# Patient Record
Sex: Female | Born: 1960 | Race: White | Hispanic: No | Marital: Married
Health system: Southern US, Community
[De-identification: ages and names within clinical notes are randomized; demographics above are authoritative.]

## PROBLEM LIST (undated history)

## (undated) DIAGNOSIS — M5126 Other intervertebral disc displacement, lumbar region: Secondary | ICD-10-CM

## (undated) DIAGNOSIS — M1990 Unspecified osteoarthritis, unspecified site: Secondary | ICD-10-CM

## (undated) DIAGNOSIS — F419 Anxiety disorder, unspecified: Secondary | ICD-10-CM

---

## 2012-07-09 ENCOUNTER — Emergency Department: Payer: Self-pay | Admitting: Emergency Medicine

## 2012-07-09 LAB — COMPREHENSIVE METABOLIC PANEL
Alkaline Phosphatase: 63 U/L (ref 50–136)
Anion Gap: 14 (ref 7–16)
BUN: 15 mg/dL (ref 7–18)
Bilirubin,Total: 0.7 mg/dL (ref 0.2–1.0)
Chloride: 109 mmol/L — ABNORMAL HIGH (ref 98–107)
Creatinine: 0.89 mg/dL (ref 0.60–1.30)
EGFR (African American): >60
EGFR (Non-African Amer.): >60
Glucose: 169 mg/dL — ABNORMAL HIGH (ref 65–99)
Osmolality: 284 (ref 275–301)
Potassium: 3.6 mmol/L (ref 3.5–5.1)
SGOT(AST): 17 U/L (ref 15–37)
SGPT (ALT): 21 U/L (ref 12–78)

## 2012-07-09 LAB — URINALYSIS, COMPLETE
Bilirubin,UR: NEGATIVE
Nitrite: NEGATIVE
Squamous Epithelial: NONE SEEN
WBC UR: 96 /HPF (ref 0–5)

## 2012-07-09 LAB — CBC
HCT: 40.7 % (ref 35.0–47.0)
HGB: 13.6 g/dL (ref 12.0–16.0)
MCHC: 33.5 g/dL (ref 32.0–36.0)
MCV: 97 fL (ref 80–100)
Platelet: 256 10*3/uL (ref 150–440)
RBC: 4.2 10*6/uL (ref 3.80–5.20)

## 2012-07-09 LAB — LIPASE, BLOOD: Lipase: 92 U/L (ref 73–393)

## 2021-01-08 ENCOUNTER — Emergency Department (HOSPITAL_COMMUNITY)
Admission: EM | Admit: 2021-01-08 | Discharge: 2021-01-08 | Disposition: A | Discharge status: Home / Self Care | Payer: Commercial Managed Care - PPO | Attending: Emergency Medicine | Admitting: Emergency Medicine

## 2021-01-08 ENCOUNTER — Other Ambulatory Visit: Payer: Self-pay

## 2021-01-08 ENCOUNTER — Emergency Department (HOSPITAL_COMMUNITY): Payer: Commercial Managed Care - PPO

## 2021-01-08 DIAGNOSIS — M79605 Pain in left leg: Secondary | ICD-10-CM | POA: Insufficient documentation

## 2021-01-08 DIAGNOSIS — M545 Low back pain, unspecified: Secondary | ICD-10-CM | POA: Insufficient documentation

## 2021-01-08 DIAGNOSIS — M25562 Pain in left knee: Secondary | ICD-10-CM | POA: Diagnosis not present

## 2021-01-08 DIAGNOSIS — R112 Nausea with vomiting, unspecified: Secondary | ICD-10-CM | POA: Insufficient documentation

## 2021-01-08 MED ORDER — ONDANSETRON 4 MG PO TBDP
8.0000 mg | ORAL_TABLET | Freq: Once | ORAL | Status: AC
  Administered 2021-01-08: 8 mg via ORAL
  Filled 2021-01-08: qty 2

## 2021-01-08 MED ORDER — MORPHINE SULFATE (PF) 4 MG/ML IV SOLN
4.0000 mg | Freq: Once | INTRAVENOUS | Status: AC
Start: 2021-01-08 — End: 2021-01-08
  Administered 2021-01-08: 4 mg via INTRAMUSCULAR
  Filled 2021-01-08: qty 1

## 2021-01-08 MED ORDER — OXYCODONE-ACETAMINOPHEN 5-325 MG PO TABS
1.0000 | ORAL_TABLET | ORAL | Status: DC | PRN
  Administered 2021-01-08: 1 via ORAL
  Filled 2021-01-08: qty 1

## 2021-01-08 MED ORDER — ONDANSETRON HCL 4 MG/2ML IJ SOLN: 4.0000 mg | Freq: Once | INTRAMUSCULAR | Status: DC

## 2021-01-08 MED ORDER — MORPHINE SULFATE (PF) 4 MG/ML IV SOLN
4.0000 mg | Freq: Once | INTRAVENOUS | Status: DC
Start: 2021-01-08 — End: 2021-01-08

## 2021-01-08 MED ORDER — LIDOCAINE 5 % EX PTCH
1.0000 | MEDICATED_PATCH | CUTANEOUS | Status: DC
  Administered 2021-01-08: 1 via TRANSDERMAL
  Filled 2021-01-08: qty 1

## 2021-01-08 MED ORDER — HYDROCODONE-ACETAMINOPHEN 5-325 MG PO TABS
1.0000 | ORAL_TABLET | Freq: Once | ORAL | Status: DC
Start: 2021-01-08 — End: 2021-01-08

## 2021-01-08 MED ORDER — MORPHINE SULFATE (PF) 4 MG/ML IV SOLN
4.0000 mg | Freq: Once | INTRAVENOUS | Status: AC
  Administered 2021-01-08: 4 mg via INTRAMUSCULAR
  Filled 2021-01-08: qty 1

## 2021-01-08 MED ORDER — PREDNISONE 10 MG PO TABS: 20.0000 mg | ORAL_TABLET | Freq: Every day | ORAL | 0 refills | Status: DC

## 2021-01-08 NOTE — Discharge Instructions (Addendum)
Please take prednisone 20 mg in the morning and at night (40 mg daily) x 5 days.  This will treat underlying inflammation and help alleviate the pain moving forward.  It may take a day or 2 before you notice complete results.  Stick with it and continue taking the medicine.  You may also take Tylenol as needed for breakthrough pain.  Follow up with your chiropractor - results are listed below.   Segmentation: 5 lumbar type vertebrae.     Alignment: Preserved.     Vertebrae: Vertebral body heights are maintained. There is no acute  fracture.     Paraspinal and other soft tissues: Unremarkable.     Disc levels: Intervertebral disc heights are maintained. There is no  significant stenosis.     IMPRESSION:  There is no compression fracture.

## 2021-01-08 NOTE — ED Triage Notes (Signed)
Pt reports left sided lower back pain that goes down her left leg.  She is hyperventilating in triage from the pain.  OTC medications are not relieving the pain.
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
## 2021-04-01 ENCOUNTER — Ambulatory Visit: Payer: Commercial Managed Care - PPO | Admitting: Dermatology

## 2021-04-01 ENCOUNTER — Other Ambulatory Visit: Payer: Self-pay

## 2021-04-01 DIAGNOSIS — L814 Other melanin hyperpigmentation: Secondary | ICD-10-CM

## 2021-04-01 DIAGNOSIS — D1801 Hemangioma of skin and subcutaneous tissue: Secondary | ICD-10-CM

## 2021-04-01 DIAGNOSIS — L578 Other skin changes due to chronic exposure to nonionizing radiation: Secondary | ICD-10-CM

## 2021-04-01 DIAGNOSIS — Z1283 Encounter for screening for malignant neoplasm of skin: Secondary | ICD-10-CM | POA: Diagnosis not present

## 2021-04-01 DIAGNOSIS — L878 Other transepidermal elimination disorders: Secondary | ICD-10-CM

## 2021-04-01 DIAGNOSIS — L821 Other seborrheic keratosis: Secondary | ICD-10-CM

## 2021-04-01 DIAGNOSIS — L988 Other specified disorders of the skin and subcutaneous tissue: Secondary | ICD-10-CM

## 2021-04-01 DIAGNOSIS — D229 Melanocytic nevi, unspecified: Secondary | ICD-10-CM

## 2021-04-01 NOTE — Progress Notes (Signed)
   New Patient Visit  Subjective  Patricia Stephenson is a 60 y.o. female who presents for the following: New Patient (Initial Visit) (Patient as new patient. Patient denies any family history of skin cancer. Patient reports some white spots at legs. ).   The following portions of the chart were reviewed this encounter and updated as appropriate:   Allergies  Meds  Problems  Med Hx  Surg Hx  Fam Hx      Review of Systems:  No other skin or systemic complaints except as noted in HPI or Assessment and Plan.  Objective  Well appearing patient in no apparent distress; mood and affect are within normal limits.  A full examination was performed including scalp, head, eyes, ears, nose, lips, neck, chest, axillae, abdomen, back, buttocks, bilateral upper extremities, bilateral lower extremities, hands, feet, fingers, toes, fingernails, and toenails. All findings within normal limits unless otherwise noted below.  lips Rhytides and volume loss.    Assessment & Plan  Elastosis of skin lips  Concerned with lines around lips    Recommend botox at lips 4 units at top and 2 units at bottom   Discussed lasts 3 - 4 months. Pt to schedule.  Also discussed peels, filler.  Lentigines - Scattered tan macules - Due to sun exposure - Benign-appearing, observe - Recommend daily broad spectrum sunscreen SPF 30+ to sun-exposed areas, reapply every 2 hours as needed. - Call for any changes  Seborrheic Keratoses - Stuck-on, waxy, tan-brown papules and/or plaques  - Benign-appearing - Discussed benign etiology and prognosis. - Observe - Call for any changes  Melanocytic Nevi - Tan-brown and/or pink-flesh-colored symmetric macules and papules - Benign appearing on exam today - Observation - Call clinic for new or changing moles - Recommend daily use of broad spectrum spf 30+ sunscreen to sun-exposed areas.   Hemangiomas - Red papules - Discussed benign nature - Observe - Call for any  changes  Actinic Damage - Chronic condition, secondary to cumulative UV/sun exposure - diffuse scaly erythematous macules with underlying dyspigmentation - Recommend daily broad spectrum sunscreen SPF 30+ to sun-exposed areas, reapply every 2 hours as needed.  - Staying in the shade or wearing long sleeves, sun glasses (UVA+UVB protection) and wide brim hats (4-inch brim around the entire circumference of the hat) are also recommended for sun protection.  - Call for new or changing lesions.  Skin cancer screening performed today.  Return for botox follow up for lips.  I, Ruthell Rummage, CMA, am acting as scribe for Forest Gleason, MD.  Documentation: I have reviewed the above documentation for accuracy and completeness, and I agree with the above.  Forest Gleason, MD

## 2021-04-01 NOTE — Patient Instructions (Addendum)
Ammonium lactate   Vaseline and band aid at excoriation at right upper back until healed    Recommend taking Heliocare sun protection supplement daily in sunny weather for additional sun protection. For maximum protection on the sunniest days, you can take up to 2 capsules of regular Heliocare OR take 1 capsule of Heliocare Ultra. For prolonged exposure (such as a full day in the sun), you can repeat your dose of the supplement 4 hours after your first dose. Heliocare can be purchased at [facility] or at [email or web address].    Melanoma ABCDEs  Melanoma is the most dangerous type of skin cancer, and is the leading cause of death from skin disease.  You are more likely to develop melanoma if you: Have light-colored skin, light-colored eyes, or red or blond hair Spend a lot of time in the sun Tan regularly, either outdoors or in a tanning bed Have had blistering sunburns, especially during childhood Have a close family member who has had a melanoma Have atypical moles or large birthmarks  Early detection of melanoma is key since treatment is typically straightforward and cure rates are extremely high if we catch it early.   The first sign of melanoma is often a change in a mole or a new dark spot.  The ABCDE system is a way of remembering the signs of melanoma.  A for asymmetry:  The two halves do not match. B for border:  The edges of the growth are irregular. C for color:  A mixture of colors are present instead of an even brown color. D for diameter:  Melanomas are usually (but not always) greater than 40mm - the size of a pencil eraser. E for evolution:  The spot keeps changing in size, shape, and color.  Please check your skin once per month between visits. You can use a small mirror in front and a large mirror behind you to keep an eye on the back side or your body.   If you see any new or changing lesions before your next follow-up, please call to schedule a visit.  Please  continue daily skin protection including broad spectrum sunscreen SPF 30+ to sun-exposed areas, reapplying every 2 hours as needed when you're outdoors.   Staying in the shade or wearing long sleeves, sun glasses (UVA+UVB protection) and wide brim hats (4-inch brim around the entire circumference of the hat) are also recommended for sun protection.    If you have any questions or concerns for your doctor, please call our main line at [phone] and press option 4 to reach your doctor's medical assistant. If no one answers, please leave a voicemail as directed and we will return your call as soon as possible. Messages left after 4 pm will be answered the following business day.   You may also send [place] a message via [place]. We typically respond to MyChart messages within 1-2 business days.  For prescription refills, please ask your pharmacy to contact our office. Our fax number is [phone].  If you have an urgent issue when the clinic is closed that cannot wait until the next business day, you can page your doctor at the number below.    Please note that while we do our best to be available for urgent issues outside of office hours, we are not available 24/7.   If you have an urgent issue and are unable to reach [place], you may choose to seek medical care at your doctor's office, retail clinic, urgent  care center, or emergency room.  If you have a medical emergency, please immediately call 911 or go to the emergency department.  Pager Numbers  - Dr. Nehemiah Massed: [phone]  - Dr. Laurence Ferrari: [phone]  - Dr. Nicole Kindred: [phone]  In the event of inclement weather, please call our main line at [phone] for an update on the status of any delays or closures.  Dermatology Medication Tips: Please keep the boxes that topical medications come in in order to help keep track of the instructions about where and how to use these. Pharmacies typically print the medication instructions only on the  boxes and not directly on the medication tubes.   If your medication is too expensive, please contact our office at [phone] option 4 or send [place] a message through [place].   We are unable to tell what your co-pay for medications will be in advance as this is different depending on your insurance coverage. However, we may be able to find a substitute medication at lower cost or fill out paperwork to get insurance to cover a needed medication.   If a prior authorization is required to get your medication covered by your insurance company, please allow [place] 1-2 business days to complete this process.  Drug prices often vary depending on where the prescription is filled and some pharmacies may offer cheaper prices.  The website www.goodrx.com contains coupons for medications through different pharmacies. The prices here do not account for what the cost may be with help from insurance (it may be cheaper with your insurance), but the website can give you the price if you did not use any insurance.  - You can print the associated coupon and take it with your prescription to the pharmacy.  - You may also stop by our office during regular business hours and pick up a GoodRx coupon card.  - If you need your prescription sent electronically to a different pharmacy, notify our office through [facility] or by phone at [phone] option 4.

## 2021-04-07 ENCOUNTER — Encounter: Payer: Self-pay | Admitting: Dermatology

## 2021-04-21 ENCOUNTER — Other Ambulatory Visit: Payer: Self-pay

## 2021-04-21 ENCOUNTER — Encounter: Payer: Self-pay | Admitting: Obstetrics & Gynecology

## 2021-04-21 ENCOUNTER — Ambulatory Visit (INDEPENDENT_AMBULATORY_CARE_PROVIDER_SITE_OTHER): Payer: Commercial Managed Care - PPO | Admitting: Obstetrics & Gynecology

## 2021-04-21 ENCOUNTER — Other Ambulatory Visit (HOSPITAL_COMMUNITY)
Admission: RE | Admit: 2021-04-21 | Discharge: 2021-04-21 | Disposition: A | Discharge status: Home / Self Care | Payer: Commercial Managed Care - PPO | Source: Ambulatory Visit | Attending: Obstetrics & Gynecology | Admitting: Obstetrics & Gynecology

## 2021-04-21 VITALS — BP 138/78 | HR 65 | Ht 62.0 in | Wt 122.0 lb

## 2021-04-21 DIAGNOSIS — N952 Postmenopausal atrophic vaginitis: Secondary | ICD-10-CM

## 2021-04-21 DIAGNOSIS — Z124 Encounter for screening for malignant neoplasm of cervix: Secondary | ICD-10-CM

## 2021-04-21 DIAGNOSIS — N941 Unspecified dyspareunia: Secondary | ICD-10-CM | POA: Diagnosis present

## 2021-04-21 MED ORDER — ESTRADIOL 0.1 MG/GM VA CREA: TOPICAL_CREAM | VAGINAL | 4 refills | Status: DC

## 2021-04-21 NOTE — Patient Instructions (Addendum)
Oral hormone therapy formulation is Prempro (covered by your insurance).  Discussed using hormone therapy and concerns about increased risk of heart disease, strokes, dangerous blood clots,   and breast cancer.

## 2021-04-21 NOTE — Progress Notes (Signed)
GYNECOLOGY OFFICE VISIT NOTE  History:   Patricia Stephenson is a 60 y.o. [id number] PMP female here today for evaluation of pain during intercourse for months. Has history of back pain, MRI done for this incidentally showed two small  (2.5 cm and 1 cm) fibroids in 01/30/2021. She tried 'warming' lubricant, this caused irritation. Pain is mostly on her left. Also reports decreased libido for years, since after menopause.  She denies any current abnormal vaginal discharge, bleeding, abdominal pain, pelvic pain or other concerns.    History reviewed. No pertinent past medical history.  Past Surgical History:  Procedure Laterality Date   CESAREAN SECTION      The following portions of the patient's history were reviewed and updated as appropriate: allergies, current medications, past family history, past medical history, past social history, past surgical history and problem list.   Health Maintenance:  Normal pap but absent EC/TZ, no HPV testing done in 11/2019 at [facility].  Normal mammogram on 12/26/2019 at [facility].   Review of Systems:  Pertinent items noted in HPI and remainder of comprehensive ROS otherwise negative.  Physical Exam:  BP 138/78   Pulse 65   Ht 5\' 2"  (1.575 m)   Wt 122 lb (55.3 kg)   BMI 22.31 kg/m  CONSTITUTIONAL: Well-developed, well-nourished female in no acute distress.  SKIN: No rash noted. Not diaphoretic. No erythema. No pallor. MUSCULOSKELETAL: Normal range of motion. No edema noted. NEUROLOGIC: Alert and oriented to person, place, and time. Normal muscle tone coordination. No cranial nerve deficit noted. PSYCHIATRIC: Normal mood and affect. Normal behavior. Normal judgment and thought content. CARDIOVASCULAR: Normal heart rate noted RESPIRATORY: Effort and breath sounds normal, no problems with respiration noted ABDOMEN: No masses noted. No other overt distention noted. No tenderness.  PELVIC: External genitalia with moderate atrophy noted, small introitus; normal urethral  meatus; atrophic vaginal mucosa and cervix.  No abnormal discharge noted.  Pap smear obtained, this caused scant bleeding due to cervical atrophy. Normal uterine size, no other palpable masses, no uterine or adnexal tenderness. Performed in the presence of a chaperone.    Assessment and Plan:     1. Dyspareunia in female Likely due to menopausal vulvovaginal atrophy. Recommended estrogen therapy (see below). Otherwise reassuring examination findings. Not concerned about fibroids as they have been present for years, and likely got smaller with menopause, not etiology of her pain. Offered pelvic ultrasound for further evaluation, she declined for now.  - Cervicovaginal ancillary only done to rule out vaginitis  2. Vaginal atrophy Recommended estrogen therapy, discussed risks and benefits.  Also discussed alternative of oral HRT (Prempro as will need estrogen and progesterone); discussed using hormone therapy and concerns about increased risk of heart disease, strokes, dangerous blood clots, and breast cancer. She wants to try estrogen cream for now, may decide to switch to Prempro later.  - estradiol (ESTRACE VAGINAL) 0.1 MG/GM vaginal cream; Use 2 g daily vaginally for two weeks, then use two to three times weekly for up to four months.  Dispense: 42.5 g; Refill: 4  3. Pap smear for cervical cancer screening - Cytology - PAP Inadequate pap smear last year, pap smear repeated this year. will follow up results and manage accordingly.  Routine preventative health maintenance measures emphasized. Please refer to After Visit Summary for other counseling recommendations.   Return for any gynecologic concerns.    I spent 20 minutes dedicated to the care of this patient including pre-visit review of records, face to face time  with the patient discussing her conditions and treatments and post visit orders.    Verita Schneiders, MD, [place] for [employer], [place]

## 2021-04-22 LAB — CYTOLOGY - PAP
Comment: NEGATIVE
Diagnosis: NEGATIVE
High risk HPV: NEGATIVE

## 2021-04-23 LAB — CERVICOVAGINAL ANCILLARY ONLY
Bacterial Vaginitis (gardnerella): NEGATIVE
Candida Glabrata: NEGATIVE
Candida Vaginitis: NEGATIVE
Chlamydia: NEGATIVE
Comment: NEGATIVE
Comment: NEGATIVE
Comment: NEGATIVE
Comment: NEGATIVE
Comment: NEGATIVE
Comment: NORMAL
Neisseria Gonorrhea: NEGATIVE
Trichomonas: NEGATIVE

## 2021-05-01 ENCOUNTER — Telehealth: Payer: Self-pay | Admitting: Obstetrics & Gynecology

## 2021-05-01 NOTE — Telephone Encounter (Signed)
Called patient regarding medication concern.  She notes nausea, headache and night sweats that recently started after using the vaginal cream.  She was curious to know next step and if she needed to be seen.  Reassured pt that an appointment is not indicated, but she could decrease either amount and/or frequency of usage.  Plan to cut back to 1g and may consider just twice per week.  Plans to follow up with Dr. Caprice Kluver, DO Attending [place], [facility] for [facility], [place]

## 2021-06-10 ENCOUNTER — Telehealth: Payer: Self-pay

## 2021-06-10 NOTE — Telephone Encounter (Signed)
This pt came in the office today in regards to her visit here on 04/21/2021 and was angry about her bill and the analyzation of her pap that she received here. She stated that she has $220 in charges for items that were performed and not covered by her insurance. When she called her insurance company her husband was there and was inquiring as to why she needed or received STI testing with a pap. Her visit is for new gyn- pain during intercourse and she was upset about the way we made her feel or look in her relationship. She wants to get a phone call from Dr A and [place] because we stated false truth on her mychart and wants to know how we can fix this

## 2021-06-11 ENCOUNTER — Encounter: Payer: Self-pay | Admitting: Obstetrics & Gynecology

## 2021-10-20 ENCOUNTER — Encounter (HOSPITAL_BASED_OUTPATIENT_CLINIC_OR_DEPARTMENT_OTHER): Payer: Self-pay | Admitting: Orthopaedic Surgery

## 2021-10-20 ENCOUNTER — Other Ambulatory Visit: Payer: Self-pay

## 2021-10-27 NOTE — H&P (Signed)
PREOPERATIVE H&P  Chief Complaint: OA RIGHT SHOULDER. ROTATOR CUFF TEAR  HPI: Patricia Stephenson is a 61 y.o. female who is scheduled for, Procedure(s): SHOULDER ARTHROSCOPY WITH SUBACROMIAL DECOMPRESSION, DISTAL CLAVICLE EXCISION, BICEPS TENODESIS, POSSIBLE ROTATOR CUFF REPAIR.   Patient has a past medical history significant for back pain.   Patient is a 61 year-old who has had ongoing pain in the right upper extremity.  She tried for 11 months to get better.  She works as a Print production planner.  She is frustrated by her shoulder.  She had care at another orthopedic office and has had multiple injections which provided temporary, but transient, relief.  She had an MRI done in November of 2022 that, per her report, demonstrated a leading edge partial thickness 50% supraspinatus tear.  There is a subscapularis tear at the upper border as well.  The long head of the biceps has some signal around it.    Symptoms are rated as moderate to severe, and have been worsening.  This is significantly impairing activities of daily living.    Please see clinic note for further details on this patient's care.    She has elected for surgical management.   Past Medical History:  Diagnosis Date   Lumbar herniated disc    Past Surgical History:  Procedure Laterality Date   CESAREAN SECTION     Social History   Socioeconomic History   Marital status: Married    Spouse name: Not on file   Number of children: Not on file   Years of education: Not on file   Highest education level: Not on file  Occupational History   Not on file  Tobacco Use   Smoking status: Never   Smokeless tobacco: Never  Substance and Sexual Activity   Alcohol use: Never   Drug use: Never   Sexual activity: Yes    Partners: Male  Other Topics Concern   Not on file  Social History Narrative   Not on file   Social Determinants of Health   Financial Resource Strain: Not on file  Food Insecurity: Not on file   Transportation Needs: Not on file  Physical Activity: Not on file  Stress: Not on file  Social Connections: Not on file   History reviewed. No pertinent family history. No Known Allergies Prior to Admission medications   Medication Sig Start Date End Date Taking? Authorizing Provider  acetaminophen (TYLENOL) 325 MG tablet Take 650 mg by mouth every 6 (six) hours as needed.   Yes [provider]  buPROPion (WELLBUTRIN SR) 200 MG 12 hr tablet Take 200 mg by mouth every morning. 01/06/21  Yes [provider]  ibuprofen (ADVIL) 200 MG tablet Take 200 mg by mouth every 6 (six) hours as needed.   Yes [provider]  Multiple Vitamin (MULTIVITAMIN) tablet Take 1 tablet by mouth daily.   Yes [provider]  celecoxib (CELEBREX) 100 MG capsule Take 100 mg by mouth 2 (two) times daily. 12/23/20   [provider]  estradiol (ESTRACE VAGINAL) 0.1 MG/GM vaginal cream Use 2 g daily vaginally for two weeks, then use two to three times weekly for up to four months. 04/21/21   Anyanwu, Sallyanne Havers, MD  LORazepam (ATIVAN) 0.5 MG tablet Take 0.5 mg by mouth 2 (two) times daily as needed for anxiety. 12/23/20   [provider]    ROS: All other systems have been reviewed and were otherwise negative with the exception of those mentioned in  the HPI and as above.  Physical Exam: General: Alert, no acute distress Cardiovascular: No pedal edema Respiratory: No cyanosis, no use of accessory musculature GI: No organomegaly, abdomen is soft and non-tender Skin: No lesions in the area of chief complaint Neurologic: Sensation intact distally Psychiatric: Patient is competent for consent with normal mood and affect Lymphatic: No axillary or cervical lymphadenopathy  MUSCULOSKELETAL:  Active forward elevation to 120.  Pain afterwards.  Passive to 160.  External rotation to 60.  Internal rotation to back pocket.  4/5 supraspinatus strength.  Positive O'Brien's,  impingement and AC tenderness to palpation.    Imaging: MRI per report demonstrated a leading edge partial thickness 50% supraspinatus tear.  There is a subscapularis tear at the upper border as well.  The long head of the biceps has some signal around it.    Assessment: OA RIGHT SHOULDER. ROTATOR CUFF TEAR  Plan: Plan for Procedure(s): SHOULDER ARTHROSCOPY WITH SUBACROMIAL DECOMPRESSION, DISTAL CLAVICLE EXCISION, BICEPS TENODESIS, POSSIBLE ROTATOR CUFF REPAIR  Patient seems to have tried and failed multiple appropriate non-operative measures.  We talked to her at length about the likelihood of continued pain, especially her previous use of narcotics.    The risks benefits and alternatives were discussed with the patient including but not limited to the risks of nonoperative treatment, versus surgical intervention including infection, bleeding, nerve injury,  blood clots, cardiopulmonary complications, morbidity, mortality, among others, and they were willing to proceed.   The patient acknowledged the explanation, agreed to proceed with the plan and consent was signed.   Operative Plan: Right shoulder scope with rotator cuff repair, distal clavicle excision, subacromial decompression and biceps tenodesis  Discharge Medications: Standard - celebrex, gabapentin, tylenol, oxycodone, zofran DVT Prophylaxis: None Physical Therapy: Outpatient PT - [facility] Special Discharge needs: Sling. Mars, PA-C  10/27/2021 4:36 PM

## 2021-10-27 NOTE — Discharge Instructions (Addendum)
Ophelia Charter MD, MPH Noemi Chapel, PA-C [place] [address] [phone] (tel)   [phone] (fax)   POST-OPERATIVE INSTRUCTIONS - SHOULDER ARTHROSCOPY  WOUND CARE You may remove the Operative Dressing on Post-Op Day #3 (72hrs after surgery).   Alternatively if you would like you can leave dressing on until follow-up if within 7-8 days but keep it dry. Leave steri-strips in place until they fall off on their own, usually 2 weeks postop. There may be a small amount of fluid/bleeding leaking at the surgical site.  This is normal; the shoulder is filled with fluid during the procedure and can leak for 24-48hrs after surgery.  You may change/reinforce the bandage as needed.  Use the Cryocuff or Ice as often as possible for the first 7 days, then as needed for pain relief. Always keep a towel, ACE wrap or other barrier between the cooling unit and your skin.  You may shower on Post-Op Day #3. Gently pat the area dry. Do not soak the shoulder in water or submerge it. Keep incisions as dry as possible. Do not go swimming in the pool or ocean until 4 weeks after surgery or when otherwise instructed.    SLING/EXERCISES: - Sling should be used at all times until follow-up. (including when you sleep!)   - You may remove sling for hygiene - Do not lift anything heavy with your operative arm! - It is normal for your fingers/hand to become more swollen after surgery and discolored from bruising.   - This will resolve over the first few weeks usually after surgery. - Please continue to ambulate and do not stay sitting or lying for too long.  - Perform foot and wrist pumps to assist in circulation.  PHYSICAL THERAPY You have a physical therapy appointment at [facility] (across the hall from our office) on Tuesday, 6/13 '@10'$  am POST-OP MEDICATIONS- Multimodal approach to pain control In general your pain will be controlled with a combination of substances.   Prescriptions unless otherwise discussed are electronically sent to your pharmacy.  This is a carefully made plan we use to minimize narcotic use.     Celebrex - Anti-inflammatory medication taken on a scheduled basis Acetaminophen - Non-narcotic pain medicine taken on a scheduled basis  Gabapentin - this is a medication to help with nerve based pain, take on a scheduled basis Oxycodone - This is a strong narcotic, to be used only on an "as needed" basis for SEVERE pain. Zofran - take as needed for nausea  FOLLOW-UP If you develop a Fever (?101.5), Redness or Drainage from the surgical incision site, please call our office to arrange for an evaluation. Please call the office to schedule a follow-up appointment 10-14 days post-operatively.    HELPFUL INFORMATION  If you had a block, it will wear off between 8-24 hrs postop typically.  This is period when your pain may go from nearly zero to the pain you would have had postop without the block.  This is an abrupt transition but nothing dangerous is happening.  You may take an extra dose of narcotic when this happens.  You may be more comfortable sleeping in a semi-seated position the first few nights following surgery.  Keep a pillow propped under the elbow and forearm for comfort.  If you have a recliner type of chair it might be beneficial.  If not that is fine too, but it would be helpful to sleep propped up with pillows behind your  operated shoulder as well under your elbow and forearm.  This will reduce pulling on the suture lines.  When dressing, put your operative arm in the sleeve first.  When getting undressed, take your operative arm out last.  Loose fitting, button-down shirts are recommended.  Often in the first days after surgery you may be more comfortable keeping your operative arm under your shirt and not through the sleeve.  You may return to work/school in the next couple of days when you feel up to it.  Desk work and typing in  the sling is fine.  We suggest you use the pain medication the first night prior to going to bed, in order to ease any pain when the anesthesia wears off. You should avoid taking pain medications on an empty stomach as it will make you nauseous.  You should wean off your narcotic medicines as soon as you are able.  Most patients will be off or using minimal narcotics before their first postop appointment.   Do not drink alcoholic beverages or take illicit drugs when taking pain medications.  It is against the law to drive while taking narcotics.  In some states it is against the law to drive while your arm is in a sling.   Pain medication may make you constipated.  Below are a few solutions to try in this order: Decrease the amount of pain medication if you aren't having pain. Drink lots of decaffeinated fluids. Drink prune juice and/or eat dried prunes  If the first 3 don't work start with additional solutions Take Colace - an over-the-counter stool softener Take Senokot - an over-the-counter laxative Take Miralax - a stronger over-the-counter laxative  For more information including helpful videos and documents visit our website:   [email or web address]-[email or web address]   Next dose of Tylenol can be taken at 1pm today. Next dose of Ibuprofen can be taken at 3pm today.   Post Anesthesia Home Care Instructions  Activity: Get plenty of rest for the remainder of the day. A responsible individual must stay with you for 24 hours following the procedure.  For the next 24 hours, DO NOT: -Drive a car -Paediatric nurse -Drink alcoholic beverages -Take any medication unless instructed by your physician -Make any legal decisions or sign important papers.  Meals: Start with liquid foods such as gelatin or soup. Progress to regular foods as tolerated. Avoid greasy, spicy, heavy foods. If nausea and/or vomiting occur, drink only clear liquids until the nausea and/or vomiting  subsides. Call your physician if vomiting continues.  Special Instructions/Symptoms: Your throat may feel dry or sore from the anesthesia or the breathing tube placed in your throat during surgery. If this causes discomfort, gargle with warm salt water. The discomfort should disappear within 24 hours.  If you had a scopolamine patch placed behind your ear for the management of post- operative nausea and/or vomiting:  1. The medication in the patch is effective for 72 hours, after which it should be removed.  Wrap patch in a tissue and discard in the trash. Wash hands thoroughly with soap and water. 2. You may remove the patch earlier than 72 hours if you experience unpleasant side effects which may include dry mouth, dizziness or visual disturbances. 3. Avoid touching the patch. Wash your hands with soap and water after contact with the patch.      Regional Anesthesia Blocks  1. Numbness or the inability to move the "blocked" extremity may last from 3-48 hours after placement. The length of  time depends on the medication injected and your individual response to the medication. If the numbness is not going away after 48 hours, call your surgeon.  2. The extremity that is blocked will need to be protected until the numbness is gone and the  Strength has returned. Because you cannot feel it, you will need to take extra care to avoid injury. Because it may be weak, you may have difficulty moving it or using it. You may not know what position it is in without looking at it while the block is in effect.  3. For blocks in the legs and feet, returning to weight bearing and walking needs to be done carefully. You will need to wait until the numbness is entirely gone and the strength has returned. You should be able to move your leg and foot normally before you try and bear weight or walk. You will need someone to be with you when you first try to ensure you do not fall and possibly risk injury.  4.  Bruising and tenderness at the needle site are common side effects and will resolve in a few days.  5. Persistent numbness or new problems with movement should be communicated to the surgeon or the [facility] [phone] [place] [phone]).    Information for Discharge Teaching: EXPAREL (bupivacaine liposome injectable suspension)   Your surgeon or anesthesiologist gave you EXPAREL(bupivacaine) to help control your pain after surgery.  EXPAREL is a local anesthetic that provides pain relief by numbing the tissue around the surgical site. EXPAREL is designed to release pain medication over time and can control pain for up to 72 hours. Depending on how you respond to EXPAREL, you may require less pain medication during your recovery.  Possible side effects: Temporary loss of sensation or ability to move in the area where bupivacaine was injected. Nausea, vomiting, constipation Rarely, numbness and tingling in your mouth or lips, lightheadedness, or anxiety may occur. Call your doctor right away if you think you may be experiencing any of these sensations, or if you have other questions regarding possible side effects.  Follow all other discharge instructions given to you by your surgeon or nurse. Eat a healthy diet and drink plenty of water or other fluids.  If you return to the hospital for any reason within 96 hours following the administration of EXPAREL, it is important for health care providers to know that you have received this anesthetic. A teal colored band has been placed on your arm with the date, time and amount of EXPAREL you have received in order to alert and inform your health care providers. Please leave this armband in place for the full 96 hours following administration, and then you may remove the band.

## 2021-10-29 ENCOUNTER — Encounter (HOSPITAL_BASED_OUTPATIENT_CLINIC_OR_DEPARTMENT_OTHER): Payer: Self-pay | Admitting: Orthopaedic Surgery

## 2021-10-29 ENCOUNTER — Ambulatory Visit (HOSPITAL_BASED_OUTPATIENT_CLINIC_OR_DEPARTMENT_OTHER): Payer: Commercial Managed Care - PPO | Admitting: Certified Registered"

## 2021-10-29 ENCOUNTER — Ambulatory Visit (HOSPITAL_BASED_OUTPATIENT_CLINIC_OR_DEPARTMENT_OTHER)
Admission: RE | Admit: 2021-10-29 | Discharge: 2021-10-29 | Disposition: A | Discharge status: Home / Self Care | Payer: Commercial Managed Care - PPO | Source: Ambulatory Visit | Attending: Orthopaedic Surgery | Admitting: Orthopaedic Surgery

## 2021-10-29 ENCOUNTER — Other Ambulatory Visit: Payer: Self-pay

## 2021-10-29 ENCOUNTER — Encounter (HOSPITAL_BASED_OUTPATIENT_CLINIC_OR_DEPARTMENT_OTHER)
Admission: RE | Disposition: A | Discharge status: Home / Self Care | Payer: Self-pay | Source: Ambulatory Visit | Attending: Orthopaedic Surgery

## 2021-10-29 DIAGNOSIS — M25811 Other specified joint disorders, right shoulder: Secondary | ICD-10-CM | POA: Insufficient documentation

## 2021-10-29 DIAGNOSIS — S46011A Strain of muscle(s) and tendon(s) of the rotator cuff of right shoulder, initial encounter: Secondary | ICD-10-CM | POA: Insufficient documentation

## 2021-10-29 DIAGNOSIS — S43431A Superior glenoid labrum lesion of right shoulder, initial encounter: Secondary | ICD-10-CM

## 2021-10-29 DIAGNOSIS — M19011 Primary osteoarthritis, right shoulder: Secondary | ICD-10-CM | POA: Diagnosis not present

## 2021-10-29 DIAGNOSIS — X58XXXA Exposure to other specified factors, initial encounter: Secondary | ICD-10-CM | POA: Insufficient documentation

## 2021-10-29 DIAGNOSIS — M7521 Bicipital tendinitis, right shoulder: Secondary | ICD-10-CM | POA: Diagnosis not present

## 2021-10-29 DIAGNOSIS — M75101 Unspecified rotator cuff tear or rupture of right shoulder, not specified as traumatic: Secondary | ICD-10-CM

## 2021-10-29 HISTORY — DX: Other intervertebral disc displacement, lumbar region: M51.26

## 2021-10-29 HISTORY — PX: SHOULDER ARTHROSCOPY W/ SUBACROMIAL DECOMPRESSION AND DISTAL CLAVICLE EXCISION: SHX2401

## 2021-10-29 SURGERY — SHOULDER ARTHROSCOPY WITH SUBACROMIAL DECOMPRESSION AND DISTAL CLAVICLE EXCISION
Anesthesia: General | Site: Shoulder | Laterality: Right

## 2021-10-29 MED ORDER — FENTANYL CITRATE (PF) 100 MCG/2ML IJ SOLN
INTRAMUSCULAR | Status: DC | PRN
  Administered 2021-10-29: 50 ug via INTRAVENOUS

## 2021-10-29 MED ORDER — PROPOFOL 10 MG/ML IV BOLUS
INTRAVENOUS | Status: AC
  Filled 2021-10-29: qty 20

## 2021-10-29 MED ORDER — DEXAMETHASONE SODIUM PHOSPHATE 4 MG/ML IJ SOLN
INTRAMUSCULAR | Status: DC | PRN
  Administered 2021-10-29: 6 mg via INTRAVENOUS

## 2021-10-29 MED ORDER — MIDAZOLAM HCL 2 MG/2ML IJ SOLN
INTRAMUSCULAR | Status: AC
  Filled 2021-10-29: qty 2

## 2021-10-29 MED ORDER — BUPIVACAINE LIPOSOME 1.3 % IJ SUSP
INTRAMUSCULAR | Status: DC | PRN
  Administered 2021-10-29: 10 mL via PERINEURAL

## 2021-10-29 MED ORDER — DEXAMETHASONE SODIUM PHOSPHATE 10 MG/ML IJ SOLN
INTRAMUSCULAR | Status: AC
  Filled 2021-10-29: qty 1

## 2021-10-29 MED ORDER — FENTANYL CITRATE (PF) 100 MCG/2ML IJ SOLN
INTRAMUSCULAR | Status: AC
  Filled 2021-10-29: qty 2

## 2021-10-29 MED ORDER — ACETAMINOPHEN 500 MG PO TABS
ORAL_TABLET | ORAL | Status: AC
  Filled 2021-10-29: qty 2

## 2021-10-29 MED ORDER — CELECOXIB 200 MG PO CAPS
200.0000 mg | ORAL_CAPSULE | Freq: Once | ORAL | Status: AC
  Administered 2021-10-29: 200 mg via ORAL

## 2021-10-29 MED ORDER — CEFAZOLIN SODIUM-DEXTROSE 2-4 GM/100ML-% IV SOLN
2.0000 g | INTRAVENOUS | Status: AC
  Administered 2021-10-29: 2 g via INTRAVENOUS

## 2021-10-29 MED ORDER — ONDANSETRON HCL 4 MG/2ML IJ SOLN
INTRAMUSCULAR | Status: AC
  Filled 2021-10-29: qty 2

## 2021-10-29 MED ORDER — SUGAMMADEX SODIUM 200 MG/2ML IV SOLN
INTRAVENOUS | Status: DC | PRN
  Administered 2021-10-29: 104.6 mg via INTRAVENOUS

## 2021-10-29 MED ORDER — ACETAMINOPHEN 500 MG PO TABS: 1000.0000 mg | ORAL_TABLET | Freq: Once | ORAL | Status: AC

## 2021-10-29 MED ORDER — TRANEXAMIC ACID-NACL 1000-0.7 MG/100ML-% IV SOLN
1000.0000 mg | INTRAVENOUS | Status: AC
  Administered 2021-10-29: 1000 mg via INTRAVENOUS

## 2021-10-29 MED ORDER — ACETAMINOPHEN 500 MG PO TABS: 1000.0000 mg | ORAL_TABLET | Freq: Once | ORAL | Status: DC

## 2021-10-29 MED ORDER — ROCURONIUM BROMIDE 100 MG/10ML IV SOLN
INTRAVENOUS | Status: DC | PRN
  Administered 2021-10-29: 40 mg via INTRAVENOUS

## 2021-10-29 MED ORDER — CELECOXIB 200 MG PO CAPS
ORAL_CAPSULE | ORAL | Status: AC
Start: 2021-10-29 — End: ?
  Filled 2021-10-29: qty 1

## 2021-10-29 MED ORDER — ONDANSETRON HCL 4 MG PO TABS: 4.0000 mg | ORAL_TABLET | Freq: Three times a day (TID) | ORAL | 0 refills | Status: AC | PRN

## 2021-10-29 MED ORDER — ACETAMINOPHEN 500 MG PO TABS: 1000.0000 mg | ORAL_TABLET | Freq: Three times a day (TID) | ORAL | 0 refills | Status: AC

## 2021-10-29 MED ORDER — CEFAZOLIN SODIUM-DEXTROSE 2-4 GM/100ML-% IV SOLN
INTRAVENOUS | Status: AC
  Filled 2021-10-29: qty 100

## 2021-10-29 MED ORDER — ROCURONIUM BROMIDE 10 MG/ML (PF) SYRINGE
PREFILLED_SYRINGE | INTRAVENOUS | Status: AC
Start: 2021-10-29 — End: ?
  Filled 2021-10-29: qty 10

## 2021-10-29 MED ORDER — GABAPENTIN 100 MG PO CAPS: 100.0000 mg | ORAL_CAPSULE | Freq: Three times a day (TID) | ORAL | 0 refills | Status: DC

## 2021-10-29 MED ORDER — GABAPENTIN 300 MG PO CAPS
300.0000 mg | ORAL_CAPSULE | Freq: Once | ORAL | Status: AC
  Administered 2021-10-29: 300 mg via ORAL

## 2021-10-29 MED ORDER — BUPIVACAINE HCL (PF) 0.5 % IJ SOLN
INTRAMUSCULAR | Status: DC | PRN
  Administered 2021-10-29: 15 mL via PERINEURAL

## 2021-10-29 MED ORDER — TRANEXAMIC ACID-NACL 1000-0.7 MG/100ML-% IV SOLN
INTRAVENOUS | Status: AC
  Filled 2021-10-29: qty 100

## 2021-10-29 MED ORDER — PROPOFOL 10 MG/ML IV BOLUS
INTRAVENOUS | Status: DC | PRN
  Administered 2021-10-29: 120 mg via INTRAVENOUS

## 2021-10-29 MED ORDER — GABAPENTIN 300 MG PO CAPS
ORAL_CAPSULE | ORAL | Status: AC
  Filled 2021-10-29: qty 1

## 2021-10-29 MED ORDER — LIDOCAINE 2% (20 MG/ML) 5 ML SYRINGE
INTRAMUSCULAR | Status: AC
  Filled 2021-10-29: qty 5

## 2021-10-29 MED ORDER — ONDANSETRON HCL 4 MG/2ML IJ SOLN
INTRAMUSCULAR | Status: DC | PRN
  Administered 2021-10-29: 4 mg via INTRAVENOUS

## 2021-10-29 MED ORDER — FENTANYL CITRATE (PF) 100 MCG/2ML IJ SOLN
50.0000 ug | Freq: Once | INTRAMUSCULAR | Status: AC
  Administered 2021-10-29: 50 ug via INTRAVENOUS

## 2021-10-29 MED ORDER — CELECOXIB 200 MG PO CAPS: 200.0000 mg | ORAL_CAPSULE | Freq: Two times a day (BID) | ORAL | 0 refills | Status: AC

## 2021-10-29 MED ORDER — SODIUM CHLORIDE 0.9 % IR SOLN
Status: DC | PRN
  Administered 2021-10-29: 9000 mL

## 2021-10-29 MED ORDER — MIDAZOLAM HCL 2 MG/2ML IJ SOLN
2.0000 mg | Freq: Once | INTRAMUSCULAR | Status: AC
  Administered 2021-10-29: 2 mg via INTRAVENOUS

## 2021-10-29 MED ORDER — LACTATED RINGERS IV SOLN: INTRAVENOUS | Status: DC

## 2021-10-29 MED ORDER — OXYCODONE HCL 5 MG PO TABS: ORAL_TABLET | ORAL | 0 refills | Status: AC

## 2021-10-29 MED ORDER — LIDOCAINE HCL (CARDIAC) PF 100 MG/5ML IV SOSY
PREFILLED_SYRINGE | INTRAVENOUS | Status: DC | PRN
  Administered 2021-10-29: 20 mg via INTRAVENOUS

## 2021-10-29 SURGICAL SUPPLY — 60 items
AID PSTN UNV HD RSTRNT DISP (MISCELLANEOUS) ×1
ANCH SUT 2 FBRTK KNTLS 1.8 (Anchor) ×2 IMPLANT
ANCHOR SUT 1.8 FIBERTAK SB KL (Anchor) ×2 IMPLANT
APL PRP STRL LF DISP 70% ISPRP (MISCELLANEOUS) ×1
BLADE EXCALIBUR 4.0X13 (MISCELLANEOUS) ×2 IMPLANT
BURR OVAL 8 FLU 4.0X13 (MISCELLANEOUS) IMPLANT
CANNULA 5.75X71 LONG (CANNULA) IMPLANT
CANNULA PASSPORT 5 (CANNULA) IMPLANT
CANNULA PASSPORT BUTTON 10-40 (CANNULA) IMPLANT
CANNULA TWIST IN 8.25X7CM (CANNULA) IMPLANT
CHLORAPREP W/TINT 26 (MISCELLANEOUS) ×2 IMPLANT
CLSR STERI-STRIP ANTIMIC 1/2X4 (GAUZE/BANDAGES/DRESSINGS) ×2 IMPLANT
COOLER ICEMAN CLASSIC (MISCELLANEOUS) ×2 IMPLANT
DRAPE IMP U-DRAPE 54X76 (DRAPES) ×2 IMPLANT
DRAPE INCISE IOBAN 66X45 STRL (DRAPES) IMPLANT
DRAPE SHOULDER BEACH CHAIR (DRAPES) ×2 IMPLANT
DRSG PAD ABDOMINAL 8X10 ST (GAUZE/BANDAGES/DRESSINGS) ×2 IMPLANT
DW OUTFLOW CASSETTE/TUBE SET (MISCELLANEOUS) ×2 IMPLANT
GAUZE SPONGE 4X4 12PLY STRL (GAUZE/BANDAGES/DRESSINGS) ×2 IMPLANT
GLOVE BIO SURGEON STRL SZ 6.5 (GLOVE) ×2 IMPLANT
GLOVE BIOGEL PI IND STRL 6.5 (GLOVE) ×1 IMPLANT
GLOVE BIOGEL PI IND STRL 8 (GLOVE) ×1 IMPLANT
GLOVE BIOGEL PI INDICATOR 6.5 (GLOVE) ×1
GLOVE BIOGEL PI INDICATOR 8 (GLOVE) ×1
GLOVE ECLIPSE 8.0 STRL XLNG CF (GLOVE) ×2 IMPLANT
GOWN STRL REUS W/ TWL LRG LVL3 (GOWN DISPOSABLE) ×2 IMPLANT
GOWN STRL REUS W/TWL LRG LVL3 (GOWN DISPOSABLE) ×4
GOWN STRL REUS W/TWL XL LVL3 (GOWN DISPOSABLE) ×2 IMPLANT
KIT SHOULDER STAB MARCO (KITS) ×2 IMPLANT
KIT STR SPEAR 1.8 FBRTK DISP (KITS) IMPLANT
LASSO 90 CVE QUICKPAS (DISPOSABLE) IMPLANT
LASSO CRESCENT QUICKPASS (SUTURE) IMPLANT
MANIFOLD NEPTUNE II (INSTRUMENTS) ×2 IMPLANT
NDL SAFETY ECLIPSE 18X1.5 (NEEDLE) ×1 IMPLANT
NDL SCORPION MULTI FIRE (NEEDLE) IMPLANT
NEEDLE HYPO 18GX1.5 SHARP (NEEDLE) ×2
NEEDLE SCORPION MULTI FIRE (NEEDLE) IMPLANT
PACK ARTHROSCOPY DSU (CUSTOM PROCEDURE TRAY) ×2 IMPLANT
PACK BASIN DAY SURGERY FS (CUSTOM PROCEDURE TRAY) ×2 IMPLANT
PAD COLD SHLDR WRAP-ON (PAD) ×2 IMPLANT
PAD ORTHO SHOULDER 7X19 LRG (SOFTGOODS) ×2 IMPLANT
PORT APPOLLO RF 90DEGREE MULTI (SURGICAL WAND) ×2 IMPLANT
RESTRAINT HEAD UNIVERSAL NS (MISCELLANEOUS) ×2 IMPLANT
SHEET MEDIUM DRAPE 40X70 STRL (DRAPES) ×2 IMPLANT
SLEEVE SCD COMPRESS KNEE MED (STOCKING) ×2 IMPLANT
SLING ARM FOAM STRAP LRG (SOFTGOODS) IMPLANT
SUT FIBERWIRE #2 38 T-5 BLUE (SUTURE)
SUT MNCRL AB 4-0 PS2 18 (SUTURE) ×2 IMPLANT
SUT PDS AB 0 CT 36 (SUTURE) IMPLANT
SUT PDS AB 1 CT  36 (SUTURE)
SUT PDS AB 1 CT 36 (SUTURE) IMPLANT
SUT TIGER TAPE 7 IN WHITE (SUTURE) IMPLANT
SUTURE FIBERWR #2 38 T-5 BLUE (SUTURE) IMPLANT
SUTURE TAPE TIGERLINK 1.3MM BL (SUTURE) IMPLANT
SUTURETAPE TIGERLINK 1.3MM BL (SUTURE)
SYR 5ML LL (SYRINGE) ×2 IMPLANT
TAPE FIBER 2MM 7IN #2 BLUE (SUTURE) IMPLANT
TOWEL GREEN STERILE FF (TOWEL DISPOSABLE) ×4 IMPLANT
TUBE CONNECTING 20X1/4 (TUBING) ×2 IMPLANT
TUBING ARTHROSCOPY IRRIG 16FT (MISCELLANEOUS) ×2 IMPLANT

## 2021-10-29 NOTE — Anesthesia Postprocedure Evaluation (Signed)
Anesthesia Post Note  Patient: Patricia Stephenson  Procedure(s) Performed: SHOULDER ARTHROSCOPY WITH SUBACROMIAL DECOMPRESSION, BICEPS TENODESIS (Right: Shoulder)     Patient location during evaluation: PACU Anesthesia Type: General Level of consciousness: awake and alert Pain management: pain level controlled Vital Signs Assessment: post-procedure vital signs reviewed and stable Respiratory status: spontaneous breathing, nonlabored ventilation, respiratory function stable and patient connected to nasal cannula oxygen Cardiovascular status: blood pressure returned to baseline and stable Postop Assessment: no apparent nausea or vomiting Anesthetic complications: no   No notable events documented.  Last Vitals:  Vitals:   10/29/21 1200 10/29/21 1212  BP: 123/88 121/73  Pulse: 69 66  Resp: 18   Temp:  (!) 36.1 C  SpO2: 95% 98%    Last Pain:  Vitals:   10/29/21 1212  TempSrc: Oral  PainSc: 0-No pain                 Tilford Deaton

## 2021-10-29 NOTE — Transfer of Care (Signed)
Immediate Anesthesia Transfer of Care Note  Patient: Patricia Stephenson  Procedure(s) Performed: SHOULDER ARTHROSCOPY WITH SUBACROMIAL DECOMPRESSION, BICEPS TENODESIS (Right: Shoulder)  Patient Location: PACU  Anesthesia Type:General and Regional  Level of Consciousness: drowsy  Airway & Oxygen Therapy: Patient Spontanous Breathing and Patient connected to face mask oxygen  Post-op Assessment: Report given to RN and Post -op Vital signs reviewed and stable  Post vital signs: Reviewed and stable  Last Vitals:  Vitals Value Taken Time  BP 119/75 10/29/21 1128  Temp    Pulse 65 10/29/21 1129  Resp 11 10/29/21 1129  SpO2 98 % 10/29/21 1129  Vitals shown include unvalidated device data.  Last Pain:  Vitals:   10/29/21 0907  TempSrc: Oral  PainSc: 2       Patients Stated Pain Goal: 2 (51/10/21 1173)  Complications: No notable events documented.

## 2021-10-29 NOTE — Anesthesia Procedure Notes (Signed)
Anesthesia Regional Block: Interscalene brachial plexus block   Pre-Anesthetic Checklist: , timeout performed,  Correct Patient, Correct Site, Correct Laterality,  Correct Procedure, Correct Position, site marked,  Risks and benefits discussed,  Surgical consent,  Pre-op evaluation,  At surgeon's request and post-op pain management  Laterality: Right  Prep: chloraprep       Needles:  Injection technique: Single-shot  Needle Type: Echogenic Needle     Needle Length: 5cm  Needle Gauge: 21     Additional Needles:   Narrative:  Start time: 10/29/2021 9:39 AM End time: 10/29/2021 9:42 AM Injection made incrementally with aspirations every 5 mL.  Performed by: Personally  Anesthesiologist: Audry Pili, MD  Additional Notes: No pain on injection. No increased resistance to injection. Injection made in 5cc increments. Good needle visualization. Patient tolerated the procedure well.

## 2021-10-29 NOTE — Interval H&P Note (Signed)
History and Physical Interval Note:  10/29/2021 9:25 AM  Patricia Stephenson  has presented today for surgery, with the diagnosis of OA RIGHT SHOULDER. ROTATOR CUFF TEAR.  The various methods of treatment have been discussed with the patient and family. After consideration of risks, benefits and other options for treatment, the patient has consented to  Procedure(s): SHOULDER ARTHROSCOPY WITH SUBACROMIAL DECOMPRESSION, DISTAL CLAVICLE EXCISION, BICEPS TENODESIS, POSSIBLE ROTATOR CUFF REPAIR (Right) as a surgical intervention.  The patient's history has been reviewed, patient examined, no change in status, stable for surgery.  I have reviewed the patient's chart and labs.  Questions were answered to the patient's satisfaction.     Hiram Gash

## 2021-10-29 NOTE — Anesthesia Procedure Notes (Signed)
Procedure Name: Intubation Date/Time: 10/29/2021 10:24 AM  Performed by: Ezequiel Kayser, CRNAPre-anesthesia Checklist: Patient identified, Emergency Drugs available, Suction available and Patient being monitored Patient Re-evaluated:Patient Re-evaluated prior to induction Oxygen Delivery Method: Circle System Utilized Preoxygenation: Pre-oxygenation with 100% oxygen Induction Type: IV induction Ventilation: Mask ventilation without difficulty Laryngoscope Size: Mac and 3 Grade View: Grade I Tube type: Oral Tube size: 7.0 mm Number of attempts: 1 Airway Equipment and Method: Stylet and Oral airway Placement Confirmation: ETT inserted through vocal cords under direct vision, positive ETCO2 and breath sounds checked- equal and bilateral Secured at: 20 cm Tube secured with: Tape Dental Injury: Teeth and Oropharynx as per pre-operative assessment

## 2021-10-29 NOTE — Anesthesia Preprocedure Evaluation (Addendum)
Anesthesia Evaluation  Patient identified by MRN, date of birth, ID band Patient awake    Reviewed: Allergy & Precautions, NPO status , Patient's Chart, lab work & pertinent test results  History of Anesthesia Complications Negative for: history of anesthetic complications  Airway Mallampati: II  TM Distance: >3 FB Neck ROM: Full    Dental  (+) Dental Advisory Given, Teeth Intact   Pulmonary neg pulmonary ROS,    Pulmonary exam normal        Cardiovascular negative cardio ROS Normal cardiovascular exam     Neuro/Psych  Intermittent radicular symptoms on right arm with cervical motion, improved per patient (but not entirely resolved) after chiropractic therapy  negative psych ROS   GI/Hepatic negative GI ROS, Neg liver ROS,   Endo/Other  negative endocrine ROS  Renal/GU negative Renal ROS     Musculoskeletal  (+) Arthritis ,   Abdominal   Peds  Hematology negative hematology ROS (+)   Anesthesia Other Findings   Reproductive/Obstetrics                            Anesthesia Physical Anesthesia Plan  ASA: 1  Anesthesia Plan: General   Post-op Pain Management: Tylenol PO (pre-op)*, Celebrex PO (pre-op)* and Regional block*   Induction: Intravenous  PONV Risk Score and Plan: 3 and Treatment may vary due to age or medical condition, Ondansetron, Dexamethasone and Midazolam  Airway Management Planned: Oral ETT  Additional Equipment: None  Intra-op Plan:   Post-operative Plan: Extubation in OR  Informed Consent: I have reviewed the patients History and Physical, chart, labs and discussed the procedure including the risks, benefits and alternatives for the proposed anesthesia with the patient or authorized representative who has indicated his/her understanding and acceptance.     Dental advisory given  Plan Discussed with: CRNA and Anesthesiologist  Anesthesia Plan Comments:         Anesthesia Quick Evaluation

## 2021-10-29 NOTE — Progress Notes (Signed)
Assisted Dr. Brock with right, interscalene , ultrasound guided block. Side rails up, monitors on throughout procedure. See vital signs in flow sheet. Tolerated Procedure well. 

## 2021-10-29 NOTE — Op Note (Signed)
Orthopaedic Surgery Operative Note (CSN: [id number])  Patricia Stephenson  01/09/1961 Date of Surgery: 10/29/2021   DIAGNOSES: Right shoulder, chronic rotator cuff tear, SLAP tear, biceps tendinitis, AC arthritis, subacromial impingement, and arthrofibrosis/adhesive capsulitis .  POST-OPERATIVE DIAGNOSIS: same  PROCEDURE: Open repair acute rotator cuff tear - 23410 Arthroscopic extensive debridement - 29823 Subdeltoid Bursa, Supraspinatus Tendon, Anterior Labrum, Superior Labrum, and capsule Arthroscopic distal clavicle excision - 96759 Arthroscopic subacromial decompression - 16384 Arthroscopic biceps tenodesis - 66599   OPERATIVE FINDING: Exam under anesthesia:  Patient limited external rotation for flexion 130 degrees, postmanipulation and released she had full motion. Articular space:  Significant capsulitis and redness throughout the inferior capsule and the anterior interval.  Rotator interval release was performed.  Release of the [facility] and the inferior capsule was performed. Chondral surfaces: Normal Biceps:  Significant tearing and redness of the biceps with a SLAP tear. Subscapularis: Intact injection and redness in the superior border which was likely mistaken for a tear on the MRI. Supraspinatus: Incomplete tear partial-thickness articular sided tear about 5% of total tendon insertional with.  In the setting of the robust adhesive capsulitis this was likely what was being read on MRI is a significant tear. Infraspinatus: Intact   Patient's redness in her capsule was concerning for eventual stiffness and she is at high risk for bicep failure as the biceps was completely shredded proximally and though we perforated the tendon with our tenodesis in May pull-through.  We did discuss at length pain control as it is very atypical to use narcotics for longer than 2 to 3 days postoperatively.  The family understood this.   Post-operative plan: The patient will be non-weightbearing in a sling for  10 days approximately, we may be able to remove at her first postop visit as she is at high risk for stiffness..  The patient will be discharged home.  DVT prophylaxis not indicated in ambulatory upper extremity patient without known risk factors.   Pain control with PRN pain medication preferring oral medicines.  We did discuss at length with the family that pain control with nonnarcotics is ideal and we would not refill narcotics.  Follow up plan will be scheduled in approximately 7 days for incision check and XR.  Surgeons:Primary: Hiram Gash, MD Assistants:Caroline McBane PA-C Location: [facility] OR ROOM 5 Anesthesia: General with Exparel interscalene block Antibiotics: Ancef 2 g Tourniquet time: None Estimated Blood Loss: Minimal Complications: None Specimens: None Implants: Implant Name Type Inv. Item Serial No. Manufacturer Lot No. LRB No. Used Action  ANCHOR SUT 1.8 FIBERTAK SB KL - [id number] Anchor ANCHOR SUT 1.8 FIBERTAK SB KL  [employer] [id number] Right 1 Implanted  ANCHOR SUT 1.8 FIBERTAK SB KL - [id number] Anchor ANCHOR SUT 1.8 [employer] [id number] Right 1 Implanted    Indications for Surgery:   Patricia Stephenson is a 61 y.o. female with continued shoulder pain refractory to nonoperative measures for extended period of time.    The risks and benefits were explained at length including but not limited to continued pain, cuff failure, biceps tenodesis failure, stiffness, need for further surgery and infection.   Procedure:   Patient was correctly identified in the preoperative holding area and operative site marked.  Patient brought to OR and positioned beachchair on an Spade table ensuring that all bony prominences were padded and the head was in an appropriate location.  Anesthesia was induced and the operative shoulder was prepped and draped in the usual sterile  fashion.  Timeout was called preincision.  A standard posterior viewing portal was made after localizing the  portal with a spinal needle.  An anterior accessory portal was also made.  After clearing the articular space the camera was positioned in the subacromial space.  Findings above.    Extensive debridement was performed of the anterior interval tissue, labral fraying and the bursa.  We identified the thickened MGHL and released this with a RF ablator.  We then used the RF ablator to skeletonize the anterior interval.  Once was complete we used a combination of the RF ablator as well as the basket device to complete a capsular release along the inferior half of the glenoid just off the glenoid labrum.  We stayed close to the labrum to avoid neurovascular damage.  At that point we removed all instruments and were able to manipulate the shoulder and demonstrate that there was improvement in range of motion as documented above.  We placed the scope back into the shoulder to ensure that there were no damage to the cuff or other structures in the shoulder.   Subacromial decompression: We made a lateral portal with spinal needle guidance. We then proceeded to debride bursal tissue extensively with a shaver and arthrocare device. At that point we continued to identify the borders of the acromion and identify the spur. We then carefully preserved the deltoid fascia and used a burr to convert the acromion to a Type 1 flat acromion without issue.  Biceps tenodesis: We marked the tendon and then performed a tenotomy and debridement of the stump in the articular space. We then identified the biceps tendon in its groove suprapec with the arthroscope in the lateral portal taking care to move from lateral to medial to avoid injury to the subscapularis. At that point we unroofed the tendon itself and mobilized it. An accessory anterior portal was made in line with the tendon and we grasped it from the anterior superior portal and worked from the accessory anterior portal. Two Fibertak 1.56m knotless anchors were placed in the  groove and the tendon was secured in a luggage loop style fashion with a pass of the limb of suture through the tendon using a scorpion device to avoid pull-through.  Repair was completed with good tension on the tendon.  Residual stump of the tendon was removed after being resected with a RF ablator.  Distal Clavicle resection:  The scope was placed in the subacromial space from the posterior portal.  A hemostat was placed through the anterior portal and we spread [facility] the [facility].  A burr was then inserted and 10 mm of distal clavicle was resected taking care to avoid damage to the capsule around the joint and avoiding overhanging bone posteriorly.    The supraspinatus was observed to be partially articulated torn but only about 5 to 10%.  We were able to debride this.  The incisions were closed with absorbable monocryl and steri strips.  A sterile dressing was placed along with a sling. The patient was awoken from general anesthesia and taken to the PACU in stable condition without complication.   CNoemi Chapel PA-C, present and scrubbed throughout the case, critical for completion in a timely fashion, and for retraction, instrumentation, closure.

## 2021-11-17 ENCOUNTER — Telehealth: Payer: Self-pay | Admitting: *Deleted

## 2021-11-17 ENCOUNTER — Other Ambulatory Visit: Payer: Self-pay | Admitting: Obstetrics & Gynecology

## 2021-11-17 ENCOUNTER — Encounter: Payer: Self-pay | Admitting: Obstetrics & Gynecology

## 2021-11-17 DIAGNOSIS — N951 Menopausal and female climacteric states: Secondary | ICD-10-CM

## 2021-11-17 DIAGNOSIS — Z79890 Hormone replacement therapy (postmenopausal): Secondary | ICD-10-CM

## 2021-11-17 MED ORDER — PREMPRO 0.3-1.5 MG PO TABS: 1.0000 | ORAL_TABLET | Freq: Every day | ORAL | 12 refills | Status: DC

## 2021-11-17 NOTE — Telephone Encounter (Signed)
Pt requesting to be changed to estrogen pill instead of the cream due to having shoulder surgery. Dr Macon Large wanted to make sure pt is aware if she switches to pill, she will need to also take progesterone. Pt is okay with that. Will have Dr A send in meds

## 2022-07-16 ENCOUNTER — Other Ambulatory Visit: Payer: Self-pay

## 2022-07-16 ENCOUNTER — Encounter (HOSPITAL_BASED_OUTPATIENT_CLINIC_OR_DEPARTMENT_OTHER): Payer: Self-pay | Admitting: Orthopaedic Surgery

## 2022-07-21 NOTE — Anesthesia Preprocedure Evaluation (Signed)
Anesthesia Evaluation  Patient identified by MRN, date of birth, ID band  Reviewed: Allergy & Precautions, NPO status , Patient's Chart, lab work & pertinent test results  History of Anesthesia Complications Negative for: history of anesthetic complications  Airway Mallampati: I       Dental no notable dental hx.    Pulmonary neg pulmonary ROS   Pulmonary exam normal        Cardiovascular negative cardio ROS Normal cardiovascular exam     Neuro/Psych   Anxiety      Intermittent radicular symptoms on right arm with cervical motion, improved per patient (but not entirely resolved) after chiropractic therapy  negative neurological ROS     GI/Hepatic negative GI ROS, Neg liver ROS,,,  Endo/Other  negative endocrine ROS    Renal/GU negative Renal ROS  negative genitourinary   Musculoskeletal  (+) Arthritis ,    Abdominal Normal abdominal exam  (+)   Peds  Hematology negative hematology ROS (+)   Anesthesia Other Findings   Reproductive/Obstetrics                             Anesthesia Physical Anesthesia Plan  ASA: 1  Anesthesia Plan: General   Post-op Pain Management: Regional block*   Induction: Intravenous  PONV Risk Score and Plan: 3 and Treatment may vary due to age or medical condition, Ondansetron, Dexamethasone and Midazolam  Airway Management Planned: Oral ETT  Additional Equipment: None  Intra-op Plan:   Post-operative Plan: Extubation in OR  Informed Consent: I have reviewed the patients History and Physical, chart, labs and discussed the procedure including the risks, benefits and alternatives for the proposed anesthesia with the patient or authorized representative who has indicated his/her understanding and acceptance.     Dental advisory given  Plan Discussed with: CRNA  Anesthesia Plan Comments:         Anesthesia Quick Evaluation

## 2022-07-22 ENCOUNTER — Ambulatory Visit (HOSPITAL_BASED_OUTPATIENT_CLINIC_OR_DEPARTMENT_OTHER): Payer: Commercial Managed Care - PPO | Admitting: Anesthesiology

## 2022-07-22 ENCOUNTER — Encounter (HOSPITAL_BASED_OUTPATIENT_CLINIC_OR_DEPARTMENT_OTHER)
Admission: RE | Disposition: A | Discharge status: Home / Self Care | Payer: Self-pay | Source: Home / Self Care | Attending: Orthopaedic Surgery

## 2022-07-22 ENCOUNTER — Ambulatory Visit (HOSPITAL_BASED_OUTPATIENT_CLINIC_OR_DEPARTMENT_OTHER)
Admission: RE | Admit: 2022-07-22 | Discharge: 2022-07-22 | Disposition: A | Discharge status: Home / Self Care | Payer: Commercial Managed Care - PPO | Attending: Orthopaedic Surgery | Admitting: Orthopaedic Surgery

## 2022-07-22 ENCOUNTER — Other Ambulatory Visit: Payer: Self-pay

## 2022-07-22 ENCOUNTER — Encounter (HOSPITAL_BASED_OUTPATIENT_CLINIC_OR_DEPARTMENT_OTHER): Payer: Self-pay | Admitting: Orthopaedic Surgery

## 2022-07-22 DIAGNOSIS — M7522 Bicipital tendinitis, left shoulder: Secondary | ICD-10-CM

## 2022-07-22 DIAGNOSIS — M19012 Primary osteoarthritis, left shoulder: Secondary | ICD-10-CM

## 2022-07-22 DIAGNOSIS — X58XXXA Exposure to other specified factors, initial encounter: Secondary | ICD-10-CM | POA: Insufficient documentation

## 2022-07-22 DIAGNOSIS — M7542 Impingement syndrome of left shoulder: Secondary | ICD-10-CM | POA: Insufficient documentation

## 2022-07-22 DIAGNOSIS — S43432A Superior glenoid labrum lesion of left shoulder, initial encounter: Secondary | ICD-10-CM

## 2022-07-22 DIAGNOSIS — M75112 Incomplete rotator cuff tear or rupture of left shoulder, not specified as traumatic: Secondary | ICD-10-CM

## 2022-07-22 DIAGNOSIS — F419 Anxiety disorder, unspecified: Secondary | ICD-10-CM | POA: Insufficient documentation

## 2022-07-22 DIAGNOSIS — S46012A Strain of muscle(s) and tendon(s) of the rotator cuff of left shoulder, initial encounter: Secondary | ICD-10-CM | POA: Diagnosis not present

## 2022-07-22 HISTORY — DX: Unspecified osteoarthritis, unspecified site: M19.90

## 2022-07-22 HISTORY — PX: SHOULDER ARTHROSCOPY WITH DISTAL CLAVICLE RESECTION: SHX5675

## 2022-07-22 HISTORY — DX: Anxiety disorder, unspecified: F41.9

## 2022-07-22 HISTORY — PX: SHOULDER ARTHROSCOPY WITH SUBACROMIAL DECOMPRESSION AND BICEP TENDON REPAIR: SHX5689

## 2022-07-22 SURGERY — SHOULDER ARTHROSCOPY WITH SUBACROMIAL DECOMPRESSION AND BICEP TENDON REPAIR
Anesthesia: General | Site: Shoulder | Laterality: Left

## 2022-07-22 MED ORDER — ACETAMINOPHEN 160 MG/5ML PO SOLN: 325.0000 mg | ORAL | Status: DC | PRN

## 2022-07-22 MED ORDER — CEFAZOLIN SODIUM-DEXTROSE 2-4 GM/100ML-% IV SOLN
2.0000 g | INTRAVENOUS | Status: AC
  Administered 2022-07-22: 2 g via INTRAVENOUS

## 2022-07-22 MED ORDER — ONDANSETRON HCL 4 MG/2ML IJ SOLN: 4.0000 mg | Freq: Once | INTRAMUSCULAR | Status: DC | PRN

## 2022-07-22 MED ORDER — MIDAZOLAM HCL 2 MG/2ML IJ SOLN
INTRAMUSCULAR | Status: AC
  Filled 2022-07-22: qty 2

## 2022-07-22 MED ORDER — FENTANYL CITRATE (PF) 100 MCG/2ML IJ SOLN
INTRAMUSCULAR | Status: DC | PRN
  Administered 2022-07-22: 100 ug via INTRAVENOUS

## 2022-07-22 MED ORDER — ACETAMINOPHEN 500 MG PO TABS: 1000.0000 mg | ORAL_TABLET | Freq: Three times a day (TID) | ORAL | 0 refills | Status: AC

## 2022-07-22 MED ORDER — PHENYLEPHRINE HCL (PRESSORS) 10 MG/ML IV SOLN
INTRAVENOUS | Status: AC
  Filled 2022-07-22: qty 1

## 2022-07-22 MED ORDER — ONDANSETRON HCL 4 MG/2ML IJ SOLN
INTRAMUSCULAR | Status: DC | PRN
  Administered 2022-07-22: 4 mg via INTRAVENOUS

## 2022-07-22 MED ORDER — ACETAMINOPHEN 500 MG PO TABS
1000.0000 mg | ORAL_TABLET | Freq: Once | ORAL | Status: AC
  Administered 2022-07-22: 1000 mg via ORAL

## 2022-07-22 MED ORDER — MEPERIDINE HCL 25 MG/ML IJ SOLN: 6.2500 mg | INTRAMUSCULAR | Status: DC | PRN

## 2022-07-22 MED ORDER — NALOXONE HCL 0.4 MG/ML IJ SOLN
INTRAMUSCULAR | Status: DC | PRN
  Administered 2022-07-22: 40 ug via INTRAVENOUS

## 2022-07-22 MED ORDER — DEXAMETHASONE SODIUM PHOSPHATE 10 MG/ML IJ SOLN
INTRAMUSCULAR | Status: AC
  Filled 2022-07-22: qty 1

## 2022-07-22 MED ORDER — PROPOFOL 10 MG/ML IV BOLUS
INTRAVENOUS | Status: DC | PRN
  Administered 2022-07-22: 160 mg via INTRAVENOUS

## 2022-07-22 MED ORDER — ONDANSETRON HCL 4 MG/2ML IJ SOLN
INTRAMUSCULAR | Status: AC
  Filled 2022-07-22: qty 2

## 2022-07-22 MED ORDER — OXYCODONE HCL 5 MG PO TABS: ORAL_TABLET | ORAL | 0 refills | Status: AC

## 2022-07-22 MED ORDER — CEFAZOLIN SODIUM-DEXTROSE 2-4 GM/100ML-% IV SOLN
INTRAVENOUS | Status: AC
  Filled 2022-07-22: qty 100

## 2022-07-22 MED ORDER — BUPIVACAINE HCL (PF) 0.5 % IJ SOLN
INTRAMUSCULAR | Status: DC | PRN
  Administered 2022-07-22 (×5): 2 mL via PERINEURAL

## 2022-07-22 MED ORDER — EPINEPHRINE PF 1 MG/ML IJ SOLN
INTRAMUSCULAR | Status: AC
  Filled 2022-07-22: qty 1

## 2022-07-22 MED ORDER — ROCURONIUM BROMIDE 100 MG/10ML IV SOLN
INTRAVENOUS | Status: DC | PRN
  Administered 2022-07-22: 40 mg via INTRAVENOUS

## 2022-07-22 MED ORDER — SODIUM CHLORIDE 0.9 % IR SOLN
Status: DC | PRN
  Administered 2022-07-22: 6000 mL

## 2022-07-22 MED ORDER — OXYCODONE HCL 5 MG/5ML PO SOLN: 5.0000 mg | Freq: Once | ORAL | Status: DC | PRN

## 2022-07-22 MED ORDER — KETOROLAC TROMETHAMINE 15 MG/ML IJ SOLN: 15.0000 mg | Freq: Once | INTRAMUSCULAR | Status: DC

## 2022-07-22 MED ORDER — LIDOCAINE 2% (20 MG/ML) 5 ML SYRINGE
INTRAMUSCULAR | Status: AC
  Filled 2022-07-22: qty 5

## 2022-07-22 MED ORDER — FENTANYL CITRATE (PF) 100 MCG/2ML IJ SOLN
100.0000 ug | Freq: Once | INTRAMUSCULAR | Status: AC
  Administered 2022-07-22: 100 ug via INTRAVENOUS

## 2022-07-22 MED ORDER — OXYCODONE HCL 5 MG PO TABS: 5.0000 mg | ORAL_TABLET | Freq: Once | ORAL | Status: DC | PRN

## 2022-07-22 MED ORDER — GABAPENTIN 300 MG PO CAPS
300.0000 mg | ORAL_CAPSULE | Freq: Once | ORAL | Status: AC
  Administered 2022-07-22: 300 mg via ORAL

## 2022-07-22 MED ORDER — ACETAMINOPHEN 10 MG/ML IV SOLN: 1000.0000 mg | Freq: Once | INTRAVENOUS | Status: DC | PRN

## 2022-07-22 MED ORDER — FENTANYL CITRATE (PF) 100 MCG/2ML IJ SOLN: 25.0000 ug | INTRAMUSCULAR | Status: DC | PRN

## 2022-07-22 MED ORDER — FENTANYL CITRATE (PF) 100 MCG/2ML IJ SOLN
INTRAMUSCULAR | Status: AC
  Filled 2022-07-22: qty 2

## 2022-07-22 MED ORDER — SUGAMMADEX SODIUM 200 MG/2ML IV SOLN
INTRAVENOUS | Status: DC | PRN
  Administered 2022-07-22: 125 mg via INTRAVENOUS

## 2022-07-22 MED ORDER — LIDOCAINE HCL (CARDIAC) PF 100 MG/5ML IV SOSY
PREFILLED_SYRINGE | INTRAVENOUS | Status: DC | PRN
  Administered 2022-07-22: 100 mg via INTRAVENOUS

## 2022-07-22 MED ORDER — ONDANSETRON HCL 4 MG PO TABS: 4.0000 mg | ORAL_TABLET | Freq: Three times a day (TID) | ORAL | 0 refills | Status: AC | PRN

## 2022-07-22 MED ORDER — ROCURONIUM BROMIDE 10 MG/ML (PF) SYRINGE
PREFILLED_SYRINGE | INTRAVENOUS | Status: AC
  Filled 2022-07-22: qty 10

## 2022-07-22 MED ORDER — DEXAMETHASONE SODIUM PHOSPHATE 4 MG/ML IJ SOLN
INTRAMUSCULAR | Status: DC | PRN
  Administered 2022-07-22: 10 mg via INTRAVENOUS

## 2022-07-22 MED ORDER — BUPIVACAINE LIPOSOME 1.3 % IJ SUSP
INTRAMUSCULAR | Status: DC | PRN
  Administered 2022-07-22 (×5): 2 mL via PERINEURAL

## 2022-07-22 MED ORDER — GABAPENTIN 300 MG PO CAPS
ORAL_CAPSULE | ORAL | Status: AC
  Filled 2022-07-22: qty 1

## 2022-07-22 MED ORDER — MIDAZOLAM HCL 2 MG/2ML IJ SOLN
2.0000 mg | Freq: Once | INTRAMUSCULAR | Status: AC
  Administered 2022-07-22: 2 mg via INTRAVENOUS

## 2022-07-22 MED ORDER — GABAPENTIN 100 MG PO CAPS: 100.0000 mg | ORAL_CAPSULE | Freq: Three times a day (TID) | ORAL | 0 refills | Status: AC

## 2022-07-22 MED ORDER — ACETAMINOPHEN 325 MG PO TABS: 325.0000 mg | ORAL_TABLET | ORAL | Status: DC | PRN

## 2022-07-22 MED ORDER — ACETAMINOPHEN 500 MG PO TABS
ORAL_TABLET | ORAL | Status: AC
  Filled 2022-07-22: qty 2

## 2022-07-22 MED ORDER — EPHEDRINE SULFATE (PRESSORS) 50 MG/ML IJ SOLN
INTRAMUSCULAR | Status: DC | PRN
  Administered 2022-07-22 (×2): 5 mg via INTRAVENOUS

## 2022-07-22 MED ORDER — PHENYLEPHRINE HCL-NACL 20-0.9 MG/250ML-% IV SOLN
INTRAVENOUS | Status: DC | PRN
  Administered 2022-07-22: 25 ug/min via INTRAVENOUS

## 2022-07-22 MED ORDER — LACTATED RINGERS IV SOLN: INTRAVENOUS | Status: DC

## 2022-07-22 MED ORDER — CELECOXIB 200 MG PO CAPS: 200.0000 mg | ORAL_CAPSULE | Freq: Two times a day (BID) | ORAL | 0 refills | Status: AC

## 2022-07-22 MED ORDER — PROPOFOL 10 MG/ML IV BOLUS
INTRAVENOUS | Status: AC
  Filled 2022-07-22: qty 20

## 2022-07-22 MED ORDER — EPHEDRINE 5 MG/ML INJ
INTRAVENOUS | Status: AC
  Filled 2022-07-22: qty 5

## 2022-07-22 SURGICAL SUPPLY — 55 items
AID PSTN UNV HD RSTRNT DISP (MISCELLANEOUS) ×1
ANCH SUT 2 FBRTK KNTLS 1.8 (Anchor) ×3 IMPLANT
ANCH SUT SWLK 19.1X4.75 (Anchor) ×1 IMPLANT
ANCHOR SUT 1.8 FIBERTAK SB KL (Anchor) IMPLANT
ANCHOR SUT BIO SW 4.75X19.1 (Anchor) IMPLANT
APL PRP STRL LF DISP 70% ISPRP (MISCELLANEOUS) ×1
BLADE EXCALIBUR 4.0X13 (MISCELLANEOUS) ×1 IMPLANT
BURR OVAL 8 FLU 4.0X13 (MISCELLANEOUS) ×1 IMPLANT
CANNULA 5.75X71 LONG (CANNULA) IMPLANT
CANNULA PASSPORT 5 (CANNULA) IMPLANT
CANNULA PASSPORT BUTTON 10-40 (CANNULA) IMPLANT
CANNULA TWIST IN 8.25X7CM (CANNULA) IMPLANT
CHLORAPREP W/TINT 26 (MISCELLANEOUS) ×1 IMPLANT
CLSR STERI-STRIP ANTIMIC 1/2X4 (GAUZE/BANDAGES/DRESSINGS) ×1 IMPLANT
COOLER ICEMAN CLASSIC (MISCELLANEOUS) ×1 IMPLANT
DRAPE IMP U-DRAPE 54X76 (DRAPES) ×1 IMPLANT
DRAPE INCISE IOBAN 66X45 STRL (DRAPES) IMPLANT
DRAPE SHOULDER BEACH CHAIR (DRAPES) ×1 IMPLANT
DW OUTFLOW CASSETTE/TUBE SET (MISCELLANEOUS) ×1 IMPLANT
GAUZE PAD ABD 8X10 STRL (GAUZE/BANDAGES/DRESSINGS) ×1 IMPLANT
GAUZE SPONGE 4X4 12PLY STRL (GAUZE/BANDAGES/DRESSINGS) ×1 IMPLANT
GLOVE BIO SURGEON STRL SZ 6.5 (GLOVE) ×1 IMPLANT
GLOVE BIOGEL PI IND STRL 6.5 (GLOVE) ×1 IMPLANT
GLOVE BIOGEL PI IND STRL 8 (GLOVE) ×1 IMPLANT
GLOVE ECLIPSE 8.0 STRL XLNG CF (GLOVE) ×1 IMPLANT
GOWN STRL REUS W/ TWL LRG LVL3 (GOWN DISPOSABLE) ×2 IMPLANT
GOWN STRL REUS W/TWL LRG LVL3 (GOWN DISPOSABLE) ×2
GOWN STRL REUS W/TWL XL LVL3 (GOWN DISPOSABLE) ×1 IMPLANT
KIT STABILIZATION SHOULDER (MISCELLANEOUS) ×1 IMPLANT
KIT STR SPEAR 1.8 FBRTK DISP (KITS) IMPLANT
LASSO CRESCENT QUICKPASS (SUTURE) IMPLANT
MANIFOLD NEPTUNE II (INSTRUMENTS) ×1 IMPLANT
NDL HD SCORPION MEGA LOADER (NEEDLE) IMPLANT
NDL SAFETY ECLIP 18X1.5 (MISCELLANEOUS) ×1 IMPLANT
PACK ARTHROSCOPY DSU (CUSTOM PROCEDURE TRAY) ×1 IMPLANT
PACK BASIN DAY SURGERY FS (CUSTOM PROCEDURE TRAY) ×1 IMPLANT
PAD COLD SHLDR WRAP-ON (PAD) ×1 IMPLANT
PORT APPOLLO RF 90DEGREE MULTI (SURGICAL WAND) ×1 IMPLANT
RESTRAINT HEAD UNIVERSAL NS (MISCELLANEOUS) ×1 IMPLANT
SHEET MEDIUM DRAPE 40X70 STRL (DRAPES) ×1 IMPLANT
SLEEVE SCD COMPRESS KNEE MED (STOCKING) ×1 IMPLANT
SLING ARM FOAM STRAP LRG (SOFTGOODS) IMPLANT
SUT FIBERWIRE #2 38 T-5 BLUE (SUTURE)
SUT MNCRL AB 4-0 PS2 18 (SUTURE) ×1 IMPLANT
SUT PDS AB 1 CT  36 (SUTURE)
SUT PDS AB 1 CT 36 (SUTURE) IMPLANT
SUT TIGER TAPE 7 IN WHITE (SUTURE) IMPLANT
SUTURE FIBERWR #2 38 T-5 BLUE (SUTURE) IMPLANT
SUTURE TAPE TIGERLINK 1.3MM BL (SUTURE) IMPLANT
SUTURETAPE TIGERLINK 1.3MM BL (SUTURE)
SYR 5ML LL (SYRINGE) ×1 IMPLANT
TAPE FIBER 2MM 7IN #2 BLUE (SUTURE) IMPLANT
TOWEL GREEN STERILE FF (TOWEL DISPOSABLE) ×2 IMPLANT
TUBE CONNECTING 20X1/4 (TUBING) ×1 IMPLANT
TUBING ARTHROSCOPY IRRIG 16FT (MISCELLANEOUS) ×1 IMPLANT

## 2022-07-22 NOTE — Discharge Instructions (Addendum)
Ophelia Charter MD, MPH Noemi Chapel, PA-C [place] [address] [phone] (tel)   [phone] (fax)   POST-OPERATIVE INSTRUCTIONS - SHOULDER ARTHROSCOPY  WOUND CARE You may remove the Operative Dressing on Post-Op Day #3 (72hrs after surgery).   Alternatively if you would like you can leave dressing on until follow-up if within 7-8 days but keep it dry. Leave steri-strips in place until they fall off on their own, usually 2 weeks postop. There may be a small amount of fluid/bleeding leaking at the surgical site.  This is normal; the shoulder is filled with fluid during the procedure and can leak for 24-48hrs after surgery.  You may change/reinforce the bandage as needed.  Use the Cryocuff or Ice as often as possible for the first 7 days, then as needed for pain relief. Always keep a towel, ACE wrap or other barrier between the cooling unit and your skin.  You may shower on Post-Op Day #3. Gently pat the area dry.  Do not soak the shoulder in water or submerge it.  Keep incisions as dry as possible. Do not go swimming in the pool or ocean until 4 weeks after surgery or when otherwise instructed.    EXERCISES Wear the sling at all times  You may remove the sling for showering, but keep the arm across the chest or in a secondary sling.     It is normal for your fingers/hand to become more swollen after surgery and discolored from bruising.   This will resolve over the first few weeks usually after surgery. Please continue to ambulate and do not stay sitting or lying for too long.  Perform foot and wrist pumps to assist in circulation.  PHYSICAL THERAPY - You will begin physical therapy soon after surgery (unless otherwise specified) - Please call to set up an appointment, if you do not already have one  - Let our office if there are any issues with scheduling your therapy  - You have a physical therapy appointment scheduled at [facility] (across  the hall from our office) on Wednesday March 6th    REGIONAL ANESTHESIA (NERVE BLOCKS) The anesthesia team may have performed a nerve block for you this is a great tool used to minimize pain.   The block may start wearing off overnight (between 8-24 hours postop) When the block wears off, your pain may go from nearly zero to the pain you would have had postop without the block. This is an abrupt transition but nothing dangerous is happening.   This can be a challenging period but utilize your as needed pain medications to try and manage this period. We suggest you use the pain medication the first night prior to going to bed, to ease this transition.  You may take an extra dose of narcotic when this happens if needed  POST-OP MEDICATIONS- Multimodal approach to pain control In general your pain will be controlled with a combination of substances.  Prescriptions unless otherwise discussed are electronically sent to your pharmacy.  This is a carefully made plan we use to minimize narcotic use.     Celebrex - Anti-inflammatory medication taken on a scheduled basis Acetaminophen - Non-narcotic pain medicine taken on a scheduled basis  Gabapentin - this is to help with nerve based pain, take on a scheduled basis Oxycodone - This is a strong narcotic, to be used only on an "as needed" basis for SEVERE pain. Zofran - take as needed for nausea  FOLLOW-UP If you develop a Fever (?101.5), Redness or Drainage from the surgical incision site, please call our office to arrange for an evaluation. Please call the office to schedule a follow-up appointment for your first post-operative appointment, 7-10 days post-operatively.    HELPFUL INFORMATION   You may be more comfortable sleeping in a semi-seated position the first few nights following surgery.  Keep a pillow propped under the elbow and forearm for comfort.  If you have a recliner type of chair it might be beneficial.  If not that is fine too,  but it would be helpful to sleep propped up with pillows behind your operated shoulder as well under your elbow and forearm.  This will reduce pulling on the suture lines.  When dressing, put your operative arm in the sleeve first.  When getting undressed, take your operative arm out last.  Loose fitting, button-down shirts are recommended.  Often in the first days after surgery you may be more comfortable keeping your operative arm under your shirt and not through the sleeve.  You may return to work/school in the next couple of days when you feel up to it.  Desk work and typing in the sling is fine.  We suggest you use the pain medication the first night prior to going to bed, in order to ease any pain when the anesthesia wears off. You should avoid taking pain medications on an empty stomach as it will make you nauseous.  You should wean off your narcotic medicines as soon as you are able.  Most patients will be off or using minimal narcotics before their first postop appointment.   Do not drink alcoholic beverages or take illicit drugs when taking pain medications.  It is against the law to drive while taking narcotics.  In some states it is against the law to drive while your arm is in a sling.   Pain medication may make you constipated.  Below are a few solutions to try in this order: Decrease the amount of pain medication if you aren't having pain. Drink lots of decaffeinated fluids. Drink prune juice and/or eat dried prunes  If the first 3 don't work start with additional solutions Take Colace - an over-the-counter stool softener Take Senokot - an over-the-counter laxative Take Miralax - a stronger over-the-counter laxative  For more information including helpful videos and documents visit our website:   [email or web address]    Post Anesthesia Home Care Instructions  Activity: Get plenty of rest for the remainder of the day. A responsible  individual must stay with you for 24 hours following the procedure.  For the next 24 hours, DO NOT: -Drive a car -Paediatric nurse -Drink alcoholic beverages -Take any medication unless instructed by your physician -Make any legal decisions or sign important papers.  Meals: Start with liquid foods such as gelatin or soup. Progress to regular foods as tolerated. Avoid greasy, spicy, heavy foods. If nausea and/or vomiting occur, drink only clear liquids until the nausea and/or vomiting subsides. Call your physician if vomiting continues.  Special Instructions/Symptoms: Your throat may feel dry or sore from the anesthesia or the breathing tube placed in your throat during surgery. If this causes discomfort, gargle with warm salt water. The discomfort should disappear within 24 hours.  Information for Discharge Teaching: EXPAREL (bupivacaine liposome injectable suspension)   Your surgeon or anesthesiologist gave you EXPAREL(bupivacaine) to help control your pain after surgery.  EXPAREL is a local anesthetic that provides pain relief by numbing the  tissue around the surgical site. EXPAREL is designed to release pain medication over time and can control pain for up to 72 hours. Depending on how you respond to EXPAREL, you may require less pain medication during your recovery.  Possible side effects: Temporary loss of sensation or ability to move in the area where bupivacaine was injected. Nausea, vomiting, constipation Rarely, numbness and tingling in your mouth or lips, lightheadedness, or anxiety may occur. Call your doctor right away if you think you may be experiencing any of these sensations, or if you have other questions regarding possible side effects.  Follow all other discharge instructions given to you by your surgeon or nurse. Eat a healthy diet and drink plenty of water or other fluids.  If you return to the hospital for any reason within 96 hours following the administration of  EXPAREL, it is important for health care providers to know that you have received this anesthetic. A teal colored band has been placed on your arm with the date, time and amount of EXPAREL you have received in order to alert and inform your health care providers. Please leave this armband in place for the full 96 hours following administration, and then you may remove the band.  Regional Anesthesia Blocks  1. Numbness or the inability to move the "blocked" extremity may last from 3-48 hours after placement. The length of time depends on the medication injected and your individual response to the medication. If the numbness is not going away after 48 hours, call your surgeon.  2. The extremity that is blocked will need to be protected until the numbness is gone and the  Strength has returned. Because you cannot feel it, you will need to take extra care to avoid injury. Because it may be weak, you may have difficulty moving it or using it. You may not know what position it is in without looking at it while the block is in effect.  3. For blocks in the legs and feet, returning to weight bearing and walking needs to be done carefully. You will need to wait until the numbness is entirely gone and the strength has returned. You should be able to move your leg and foot normally before you try and bear weight or walk. You will need someone to be with you when you first try to ensure you do not fall and possibly risk injury.  4. Bruising and tenderness at the needle site are common side effects and will resolve in a few days.  5. Persistent numbness or new problems with movement should be communicated to the surgeon or the [place] [phone] [place] [phone]).  No tylenol if needed until after 12:30 pm

## 2022-07-22 NOTE — Transfer of Care (Signed)
Immediate Anesthesia Transfer of Care Note  Patient: Patricia Stephenson  Procedure(s) Performed: SHOULDER ARTHROSCOPY WITH SUBACROMIAL DECOMPRESSION AND BICEP TENDON REPAIR WITH ROTATOR CUFF REPAIR (Left: Shoulder) SHOULDER ARTHROSCOPY WITH DISTAL CLAVICLE EXCISION (Left: Shoulder)  Patient Location: PACU  Anesthesia Type:GA combined with regional for post-op pain  Level of Consciousness: drowsy and patient cooperative  Airway & Oxygen Therapy: Patient Spontanous Breathing and Patient connected to face mask oxygen  Post-op Assessment: Report given to RN and Post -op Vital signs reviewed and stable  Post vital signs: Reviewed and stable  Last Vitals:  Vitals Value Taken Time  BP 133/76 07/22/22 0843  Temp    Pulse 72 07/22/22 0844  Resp 11 07/22/22 0844  SpO2 100 % 07/22/22 0844  Vitals shown include unvalidated device data.  Last Pain:  Vitals:   07/22/22 0627  TempSrc: Temporal  PainSc: 0-No pain      Patients Stated Pain Goal: 6 ([id number])  Complications: No notable events documented.

## 2022-07-22 NOTE — Interval H&P Note (Signed)
All questions answered, patient wants to proceed with procedure.  

## 2022-07-22 NOTE — Op Note (Signed)
Orthopaedic Surgery Operative Note (CSN: [id number])  Patricia Stephenson  [birth year] Date of Surgery: 07/22/2022   DIAGNOSES: Left shoulder, acute on chronic rotator cuff tear, SLAP tear, biceps tendinitis, AC arthritis, and subacromial impingement.  POST-OPERATIVE DIAGNOSIS: same  PROCEDURE: Arthroscopic extensive debridement - 29823 Subdeltoid Bursa, Supraspinatus Tendon, Anterior Labrum, and Posterior Labrum Arthroscopic distal clavicle excision [id number] Arthroscopic subacromial decompression - [id number] Arthroscopic rotator cuff repair [id number] Arthroscopic biceps tenodesis [id number]   OPERATIVE FINDING: Exam under anesthesia: Normal Articular space:  Significant redness in the anterior interval and anterior capsule.  We released all of this tissue and debrided it to try and avoid stiffness. Chondral surfaces: Normal Biceps:  Type II SLAP tear and significant tearing of the biceps proximally Subscapularis: Incomplete tear upper 30% of the tendon was torn with retraction, [facility] L was not initially visible. Supraspinatus: Intact  Infraspinatus: Intact      Post-operative plan: The patient will be non-weightbearing in a sling for 6 weeks with aggressive therapy to avoid stiffness as she is at high risk for stiffness.  The patient will be discharged home.  DVT prophylaxis not indicated in ambulatory upper extremity patient without known risk factors.   Pain control with PRN pain medication preferring oral medicines.  Follow up plan will be scheduled in approximately 7 days for incision check and XR.  Surgeons:Primary: Hiram Gash, MD Assistants:Caroline McBane PA-C Location: [facility] OR ROOM 6 Anesthesia: General with Exparel interscalene block Antibiotics: Ancef 2 g Tourniquet time: None Estimated Blood Loss: Minimal Complications: None Specimens: None Implants: Implant Name Type Inv. Item Serial No. Manufacturer Lot No. LRB No. Used Action  ANCHOR SUT 1.8 FIBERTAK SB KL - [id number] Anchor  ANCHOR SUT 1.8 FIBERTAK SB KL  [employer] [id number] Left 1 Implanted  ANCHOR SUT 1.8 FIBERTAK SB KL - [id number] Anchor ANCHOR SUT 1.8 FIBERTAK SB KL  [employer] [id number] Left 1 Implanted  ANCHOR SUT BIO SW 4.75X19.1 - [id number] Anchor ANCHOR SUT BIO SW 4.75X19.1  [employer] [id number] Left 1 Implanted  ANCHOR SUT 1.8 FIBERTAK SB KL - [id number] Anchor ANCHOR SUT 1.8 [employer] [id number] Left 1 Implanted    Indications for Surgery:   Patricia Stephenson is a 62 y.o. female with continued shoulder pain refractory to nonoperative measures for extended period of time.   The risks and benefits were explained at length including but not limited to continued pain, cuff failure, biceps tenodesis failure, stiffness, need for further surgery and infection.   Procedure:   Patient was correctly identified in the preoperative holding area and operative site marked.  Patient brought to OR and positioned beachchair on an [place] table ensuring that all bony prominences were padded and the head was in an appropriate location.  Anesthesia was induced and the operative shoulder was prepped and draped in the usual sterile fashion.  Timeout was called preincision.  A standard posterior viewing portal was made after localizing the portal with a spinal needle.  An anterior accessory portal was also made.  After clearing the articular space the camera was positioned in the subacromial space.  Findings above.    Extensive debridement was performed of the anterior interval tissue, labral fraying and the bursa.  We debrided glenoid bone, glenoid cartilage, humeral bone and humeral cartilage.  Additionally CA ligament and bursal tissue.  Subacromial decompression: We made a lateral portal with spinal needle guidance. We then proceeded to debride bursal tissue extensively with a shaver and arthrocare device. At  that point we continued to identify the borders of the acromion and identify the spur. We then carefully  preserved the deltoid fascia and used a burr to convert the acromion to a Type 1 flat acromion without issue.  Biceps tenodesis: We marked the tendon and then performed a tenotomy and debridement of the stump in the articular space. We then identified the biceps tendon in its groove suprapec with the arthroscope in the lateral portal taking care to move from lateral to medial to avoid injury to the subscapularis. At that point we unroofed the tendon itself and mobilized it. An accessory anterior portal was made in line with the tendon and we grasped it from the anterior superior portal and worked from the accessory anterior portal. Two Fibertak 1.36m knotless anchors were placed in the groove and the tendon was secured in a luggage loop style fashion with a pass of the limb of suture through the tendon using a scorpion device to avoid pull-through.  Repair was completed with good tension on the tendon.  Residual stump of the tendon was removed after being resected with a RF ablator.  Distal Clavicle resection:  The scope was placed in the subacromial space from the posterior portal.  A hemostat was placed through the anterior portal and we [facility] the [facility].  A burr was then inserted and 10 mm of distal clavicle was resected taking care to avoid damage to the capsule around the joint and avoiding overhanging bone posteriorly.    Subscapularis Repair: We identified a subscapularis tear that involved 30.  Using a grasping device were able to demonstrate that the tendon could be reapproximated to the lesser tuberosity.  We felt that it was repairable.  We then used a RF ablator to open the rotator interval and released the MGHL to allow further translation of the subscapularis.  This point we cleared both anterior and posterior to the tendon taking care to avoid inferior migration to avoid the neurovascular structures as well as the muscular cutaneous nerve.  We then used a lasso to pass a fiber tape in a  mattress fashion through the subscapularis and based 4.75 swivel lock was used to repair back to the lesser tuberosity after prepping the tuberosity extensively.  We had good approximation of the tendon and a recreation of the rolled border.   The incisions were closed with absorbable monocryl and steri strips.  A sterile dressing was placed along with a sling. The patient was awoken from general anesthesia and taken to the PACU in stable condition without complication.   CNoemi Chapel PA-C, present and scrubbed throughout the case, critical for completion in a timely fashion, and for retraction, instrumentation, closure.

## 2022-07-22 NOTE — Progress Notes (Signed)
Assisted Dr. Jillyn Hidden with left, interscalene , ultrasound guided block. Side rails up, monitors on throughout procedure. See vital signs in flow sheet. Tolerated Procedure well.

## 2022-07-22 NOTE — Anesthesia Postprocedure Evaluation (Signed)
Anesthesia Post Note  Patient: Patricia Stephenson  Procedure(s) Performed: SHOULDER ARTHROSCOPY WITH SUBACROMIAL DECOMPRESSION AND BICEP TENDON REPAIR WITH ROTATOR CUFF REPAIR (Left: Shoulder) SHOULDER ARTHROSCOPY WITH DISTAL CLAVICLE EXCISION (Left: Shoulder)     Patient location during evaluation: Phase II Anesthesia Type: General Level of consciousness: awake Pain management: pain level controlled Vital Signs Assessment: post-procedure vital signs reviewed and stable Respiratory status: spontaneous breathing Cardiovascular status: stable Postop Assessment: no apparent nausea or vomiting Anesthetic complications: no  No notable events documented.  Last Vitals:  Vitals:   07/22/22 0900 07/22/22 0915  BP: (!) 119/91 133/78  Pulse: 86 70  Resp: 14 13  Temp:    SpO2: 98% 95%    Last Pain:  Vitals:   07/22/22 0900  TempSrc:   PainSc: 0-No pain                 Huston Foley

## 2022-07-22 NOTE — H&P (Signed)
PREOPERATIVE H&P  Chief Complaint: left shoulder cartilage disorder, impingement syndrome , biceps tendinitis, OA  HPI: Patricia Stephenson is a 62 y.o. female who is scheduled for, Procedure(s): SHOULDER ARTHROSCOPY WITH SUBACROMIAL DECOMPRESSION AND BICEP TENDON REPAIR SHOULDER ARTHROSCOPY WITH DISTAL CLAVICLE EXCISION.   The patient is a 62 year old who Dr. Griffin Basil did a right shoulder scope with biceps tenodesis and lysis of adhesions on in June 2023.  She did extremely well with this.  She still had left shoulder pain.  She notes it began when the right shoulder pain began about a year and a half ago.  She has tried to treat it conservatively with anti-inflammatories, but she has not made much improvement.  Now she hurts all the time.  She cannot use it to put her jacket on.  She has trouble reaching behind.  She sometimes has burning pain.  The pain causes her to be quite tearful and this caused her to quit her job because she was having so much pain.  She had significant pain to her right shoulder as well prior to surgery, but she did extremely well after this.    Symptoms are rated as moderate to severe, and have been worsening.  This is significantly impairing activities of daily living.    Please see clinic note for further details on this patient's care.    She has elected for surgical management.   Past Medical History:  Diagnosis Date   Anxiety    Arthritis    Lumbar herniated disc    Past Surgical History:  Procedure Laterality Date   CESAREAN SECTION     SHOULDER ARTHROSCOPY W/ SUBACROMIAL DECOMPRESSION AND DISTAL CLAVICLE EXCISION  10/29/2021   [facility]   Social History   Socioeconomic History   Marital status: Married    Spouse name: Not on file   Number of children: Not on file   Years of education: Not on file   Highest education level: Not on file  Occupational History   Not on file  Tobacco Use   Smoking status: Never   Smokeless tobacco: Never  Vaping Use    Vaping Use: Never used  Substance and Sexual Activity   Alcohol use: Not Currently   Drug use: Never   Sexual activity: Yes    Partners: Male  Other Topics Concern   Not on file  Social History Narrative   Not on file   Social Determinants of Health   Financial Resource Strain: Not on file  Food Insecurity: Not on file  Transportation Needs: Not on file  Physical Activity: Not on file  Stress: Not on file  Social Connections: Not on file   History reviewed. No pertinent family history. No Known Allergies Prior to Admission medications   Medication Sig Start Date End Date Taking? Authorizing Provider  buPROPion (WELLBUTRIN SR) 200 MG 12 hr tablet Take 200 mg by mouth every morning. 01/06/21  Yes [provider]  estrogen, conjugated,-medroxyprogesterone (PREMPRO) 0.3-1.5 MG tablet Take 1 tablet by mouth daily. 11/17/21  Yes Anyanwu, Sallyanne Havers, MD  LORazepam (ATIVAN) 0.5 MG tablet Take 0.5 mg by mouth 2 (two) times daily as needed for anxiety. 12/23/20  Yes [provider]  Multiple Vitamin (MULTIVITAMIN) tablet Take 1 tablet by mouth daily.   Yes [provider]    ROS: All other systems have been reviewed and were otherwise negative with the exception of those mentioned in the HPI and as above.  Physical Exam: General: Alert, no acute  distress Cardiovascular: No pedal edema Respiratory: No cyanosis, no use of accessory musculature GI: No organomegaly, abdomen is soft and non-tender Skin: No lesions in the area of chief complaint Neurologic: Sensation intact distally Psychiatric: Patient is competent for consent with normal mood and affect Lymphatic: No axillary or cervical lymphadenopathy  MUSCULOSKELETAL:  The range of motion of the shoulder is to 170 degrees.  She has a positive AC tenderness with palpation. Impingement with O'Brien's. Grossly positive bicipital groove tenderness to palpation and Speed's.  Imaging: The MRI demonstrates  interstitial tearing of the biceps. She has a Type II acromion. AC arthrosis with edema in the distal clavicle. Intact rotator cuff on appearance.   Assessment: left shoulder cartilage disorder, impingement syndrome , biceps tendinitis, OA  Plan: Plan for Procedure(s): SHOULDER ARTHROSCOPY WITH SUBACROMIAL DECOMPRESSION AND BICEP TENDON REPAIR SHOULDER ARTHROSCOPY WITH DISTAL CLAVICLE EXCISION  The risks benefits and alternatives were discussed with the patient including but not limited to the risks of nonoperative treatment, versus surgical intervention including infection, bleeding, nerve injury,  blood clots, cardiopulmonary complications, morbidity, mortality, among others, and they were willing to proceed.   The patient acknowledged the explanation, agreed to proceed with the plan and consent was signed.   Operative Plan: Left shoulder scope with BT, DCE, SAD Discharge Medications: Standard DVT Prophylaxis: None Physical Therapy: outpatient PT Special Discharge needs: Sling. [place], PA-C  07/22/2022 6:39 AM

## 2022-07-22 NOTE — Anesthesia Procedure Notes (Addendum)
Anesthesia Regional Block: Interscalene brachial plexus block   Pre-Anesthetic Checklist: , timeout performed,  Correct Patient, Correct Site, Correct Laterality,  Correct Procedure, Correct Position, site marked,  Risks and benefits discussed,  Surgical consent,  Pre-op evaluation,  At surgeon's request and post-op pain management  Laterality: Left and Upper  Prep: chloraprep       Needles:  Injection technique: Single-shot  Needle Type: Echogenic Needle     Needle Length: 9cm  Needle Gauge: 20   Needle insertion depth: 1 cm   Additional Needles:   Procedures:,,,, ultrasound used (permanent image in chart),,    Narrative:  Start time: 07/22/2022 7:05 AM End time: 07/22/2022 7:13 AM Injection made incrementally with aspirations every 5 mL.  Performed by: Personally  Anesthesiologist: Lyn Hollingshead, MD

## 2022-07-22 NOTE — Anesthesia Procedure Notes (Signed)
Procedure Name: Intubation Date/Time: 07/22/2022 7:36 AM  Performed by: Glory Buff, CRNAPre-anesthesia Checklist: Patient identified, Emergency Drugs available, Suction available and Patient being monitored Patient Re-evaluated:Patient Re-evaluated prior to induction Oxygen Delivery Method: Circle system utilized Preoxygenation: Pre-oxygenation with 100% oxygen Induction Type: IV induction Ventilation: Mask ventilation without difficulty Laryngoscope Size: Miller and 3 Grade View: Grade I Tube type: Oral Tube size: 7.0 mm Number of attempts: 1 Airway Equipment and Method: Stylet and Oral airway Placement Confirmation: ETT inserted through vocal cords under direct vision, positive ETCO2 and breath sounds checked- equal and bilateral Secured at: 19 cm Tube secured with: Tape Dental Injury: Teeth and Oropharynx as per pre-operative assessment

## 2022-07-23 ENCOUNTER — Encounter (HOSPITAL_BASED_OUTPATIENT_CLINIC_OR_DEPARTMENT_OTHER): Payer: Self-pay | Admitting: Orthopaedic Surgery

## 2022-10-18 IMAGING — CT CT L SPINE W/O CM
3 series · 14 of 33 positions shown, 17 images · non-contrast
Comparison: None.

CLINICAL DATA: Compression fracture, lumbar spine

EXAM:
CT LUMBAR SPINE WITHOUT CONTRAST
TECHNIQUE: Multidetector CT imaging of the lumbar spine was performed without
intravenous contrast administration. Multiplanar CT image
reconstructions were also generated.

[Series 8: coronal st · coronal · 0.34mm/px · 3 of 68 slices shown]
[im 14/68  bone]
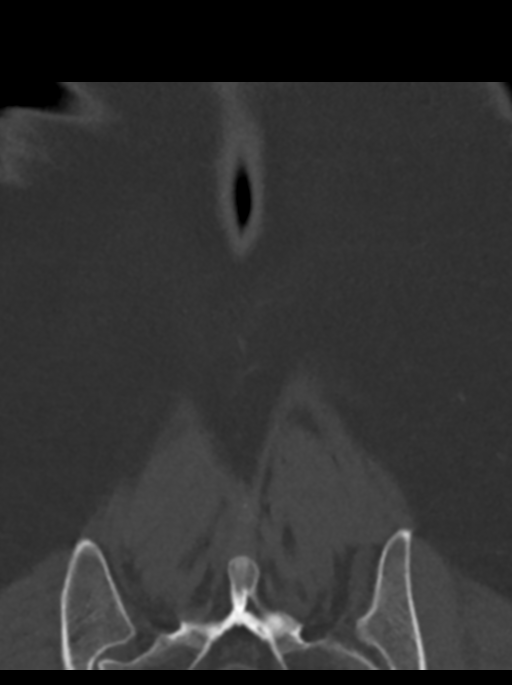
[im 27/68  bone]
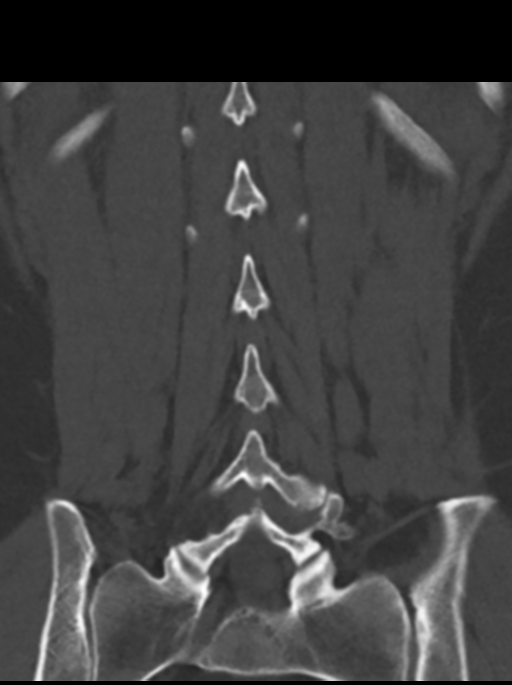
[im 41/68  bone]
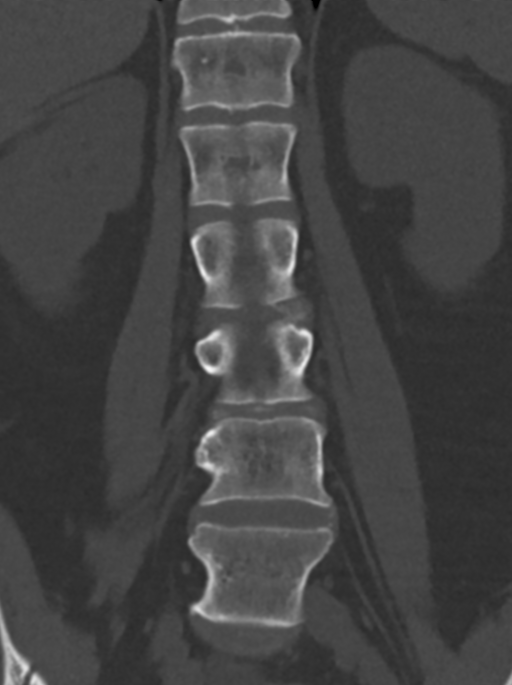

[Series 9: sagittal st · sagittal · 0.34mm/px · 5 of 76 slices shown, 6 images]
[im 26/76  bone]
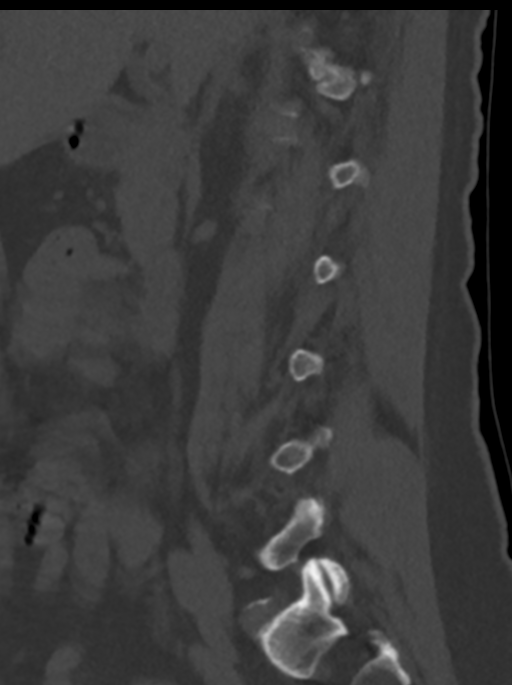
[im 32/76  bone]
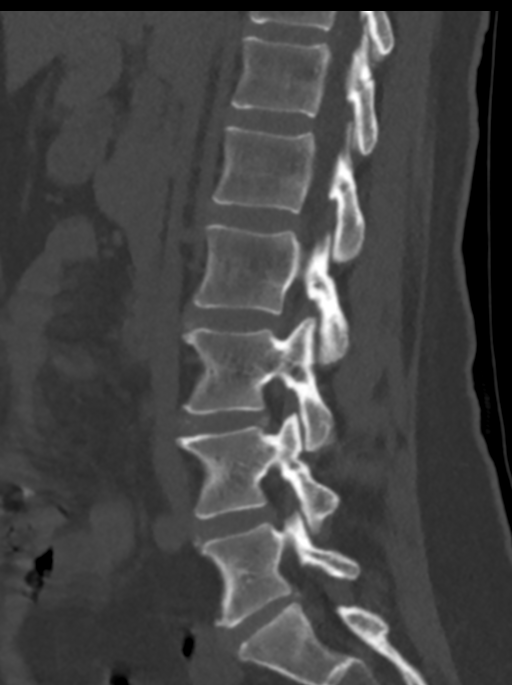
[im 38/76  soft-tissue]
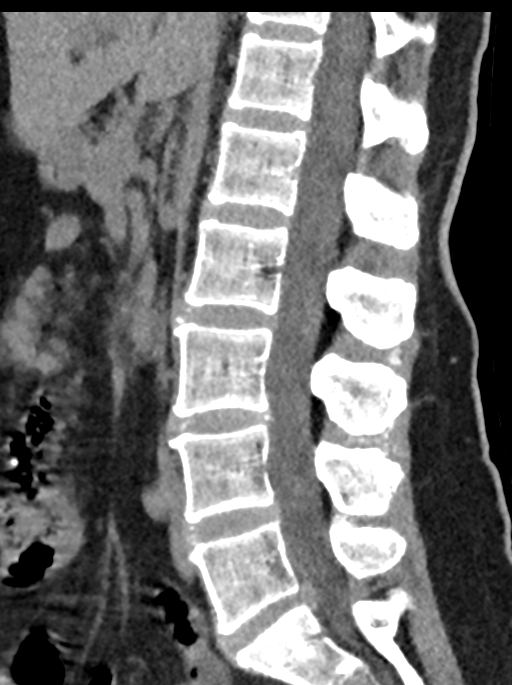
[im 38/76  bone]
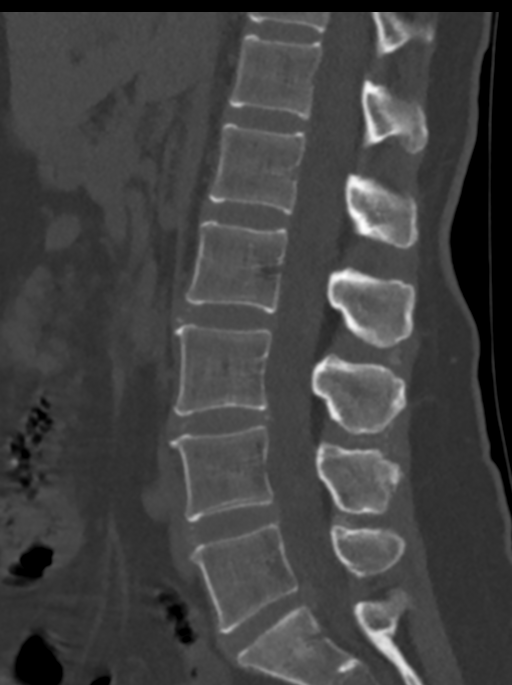
[im 44/76  bone]
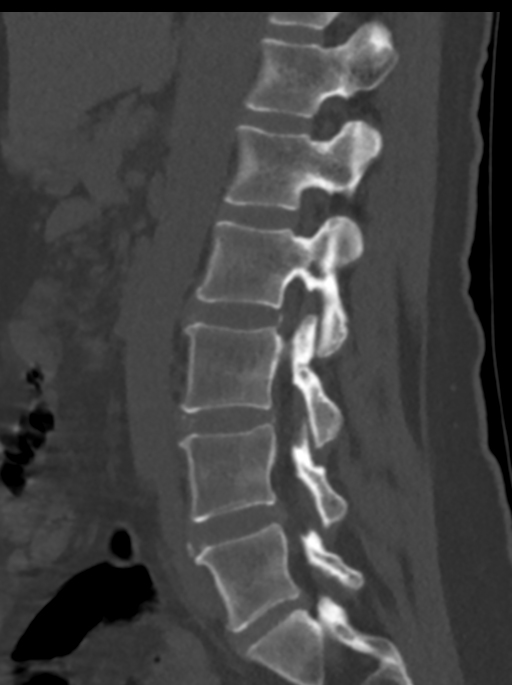
[im 51/76  bone]
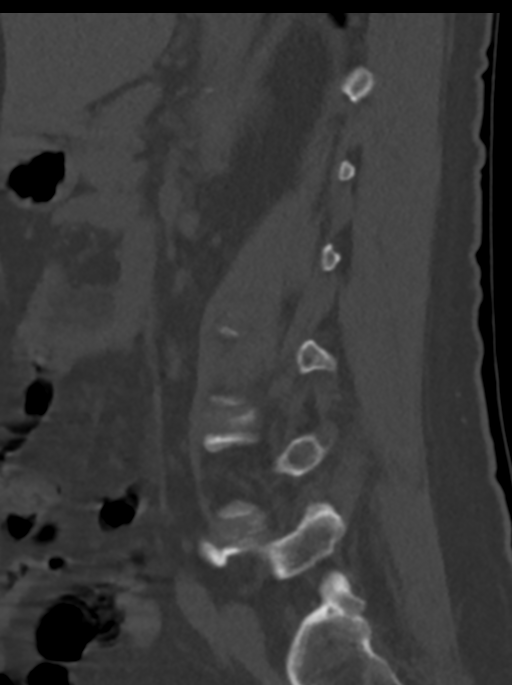

[Series 10: l spine 2.0 st · axial · 0.31mm/px · z∈[-655,-479]mm · 6 of 115 slices shown, 8 images]
[im 18/115  soft-tissue]
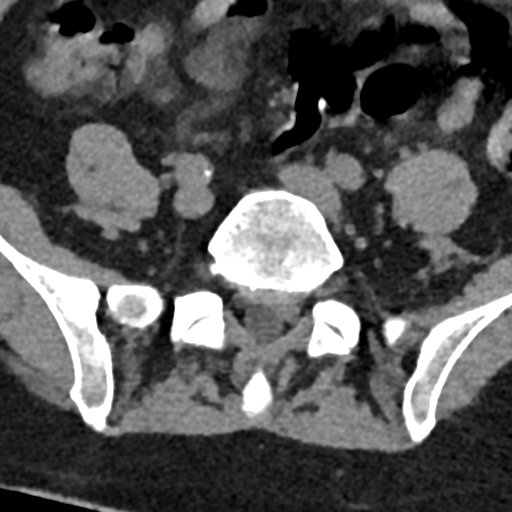
[im 18/115  bone]
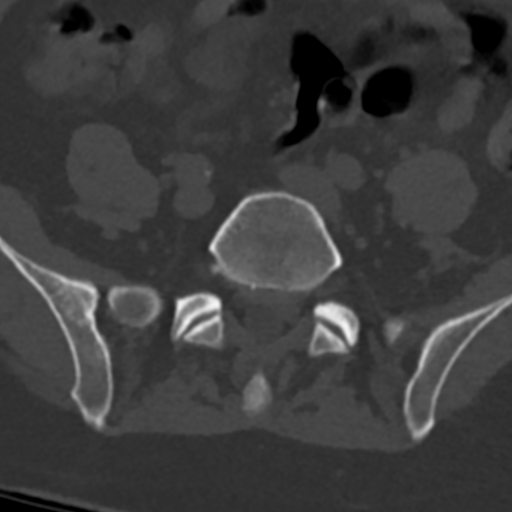
[im 36/115  bone]
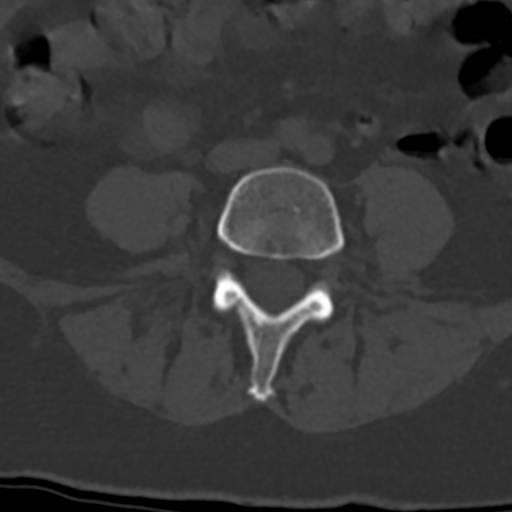
[im 53/115  bone]
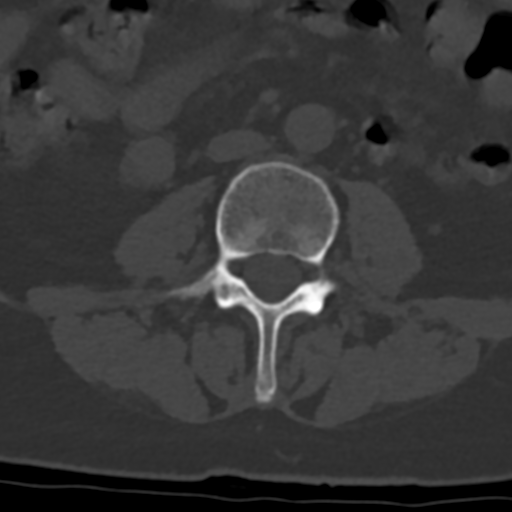
[im 71/115  bone]
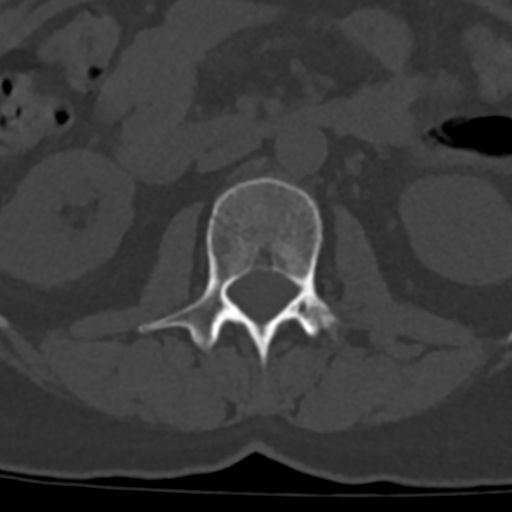
[im 88/115  soft-tissue]
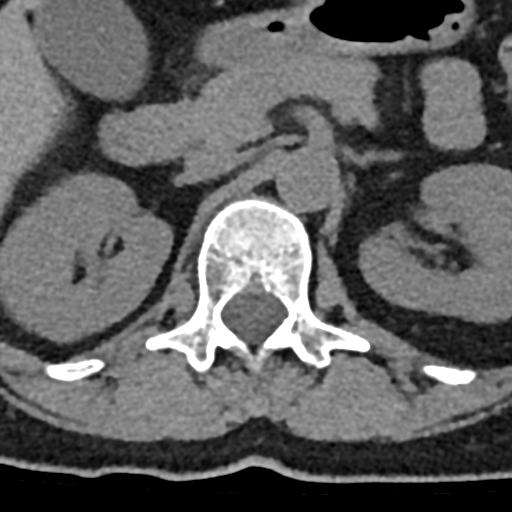
[im 88/115  bone]
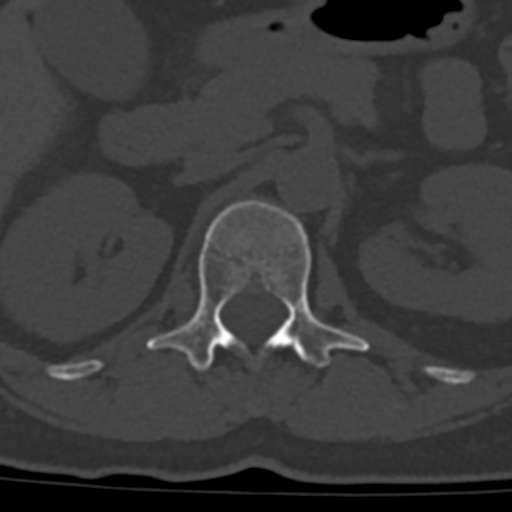
[im 106/115  bone]
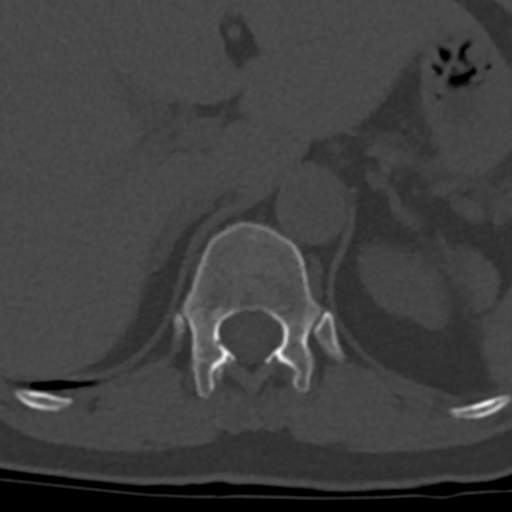

[14 of 33 positions shown; findings below may reference images not displayed]

FINDINGS: Segmentation: 5 lumbar type vertebrae.

Alignment: Preserved.

Vertebrae: Vertebral body heights are maintained. There is no acute
fracture.

Paraspinal and other soft tissues: Unremarkable.

Disc levels: Intervertebral disc heights are maintained. There is no
significant stenosis.
IMPRESSION: There is no compression fracture.

## 2022-12-16 ENCOUNTER — Other Ambulatory Visit: Payer: Self-pay | Admitting: Obstetrics & Gynecology

## 2022-12-16 DIAGNOSIS — N951 Menopausal and female climacteric states: Secondary | ICD-10-CM

## 2022-12-16 DIAGNOSIS — Z79890 Hormone replacement therapy (postmenopausal): Secondary | ICD-10-CM
